# Patient Record
Sex: Male | Born: 1970 | Race: Black or African American | Hispanic: No | Marital: Married | Smoking: Former smoker
Health system: Southern US, Community
[De-identification: ages and names within clinical notes are randomized; demographics above are authoritative.]

## PROBLEM LIST (undated history)

## (undated) DIAGNOSIS — E785 Hyperlipidemia, unspecified: Secondary | ICD-10-CM

## (undated) DIAGNOSIS — E119 Type 2 diabetes mellitus without complications: Secondary | ICD-10-CM

## (undated) DIAGNOSIS — I10 Essential (primary) hypertension: Secondary | ICD-10-CM

## (undated) DIAGNOSIS — K859 Acute pancreatitis without necrosis or infection, unspecified: Secondary | ICD-10-CM

## (undated) DIAGNOSIS — M1990 Unspecified osteoarthritis, unspecified site: Secondary | ICD-10-CM

## (undated) HISTORY — PX: OTHER SURGICAL HISTORY: SHX169

## (undated) HISTORY — PX: BACK SURGERY: SHX140

## (undated) HISTORY — DX: Hyperlipidemia, unspecified: E78.5

## (undated) HISTORY — DX: Unspecified osteoarthritis, unspecified site: M19.90

## (undated) HISTORY — DX: Acute pancreatitis without necrosis or infection, unspecified: K85.90

---

## 2004-02-04 DIAGNOSIS — K859 Acute pancreatitis without necrosis or infection, unspecified: Secondary | ICD-10-CM

## 2004-02-04 HISTORY — DX: Acute pancreatitis without necrosis or infection, unspecified: K85.90

## 2004-09-07 ENCOUNTER — Emergency Department: Payer: Self-pay | Admitting: General Practice

## 2004-09-11 ENCOUNTER — Ambulatory Visit: Payer: Self-pay | Admitting: Specialist

## 2004-10-24 ENCOUNTER — Encounter: Admission: RE | Admit: 2004-10-24 | Discharge: 2004-10-24 | Payer: Self-pay | Admitting: Neurosurgery

## 2004-11-15 ENCOUNTER — Ambulatory Visit (HOSPITAL_COMMUNITY): Admission: RE | Admit: 2004-11-15 | Discharge: 2004-11-16 | Payer: Self-pay | Admitting: Neurosurgery

## 2005-09-02 ENCOUNTER — Ambulatory Visit: Payer: Self-pay | Admitting: Internal Medicine

## 2005-11-21 ENCOUNTER — Ambulatory Visit (HOSPITAL_COMMUNITY): Admission: RE | Admit: 2005-11-21 | Discharge: 2005-11-22 | Payer: Self-pay | Admitting: Neurosurgery

## 2006-05-17 ENCOUNTER — Emergency Department: Payer: Self-pay | Admitting: Emergency Medicine

## 2006-05-22 ENCOUNTER — Ambulatory Visit: Payer: Self-pay | Admitting: Neurosurgery

## 2006-05-26 ENCOUNTER — Encounter: Admission: RE | Admit: 2006-05-26 | Discharge: 2006-05-26 | Payer: Self-pay | Admitting: Neurosurgery

## 2006-08-03 ENCOUNTER — Inpatient Hospital Stay (HOSPITAL_COMMUNITY): Admission: RE | Admit: 2006-08-03 | Discharge: 2006-08-05 | Payer: Self-pay | Admitting: Neurosurgery

## 2006-09-14 ENCOUNTER — Emergency Department (HOSPITAL_COMMUNITY): Admission: EM | Admit: 2006-09-14 | Discharge: 2006-09-14 | Payer: Self-pay | Admitting: Emergency Medicine

## 2006-09-15 ENCOUNTER — Encounter: Admission: RE | Admit: 2006-09-15 | Discharge: 2006-09-15 | Payer: Self-pay | Admitting: Neurosurgery

## 2006-09-17 ENCOUNTER — Encounter: Admission: RE | Admit: 2006-09-17 | Discharge: 2006-09-17 | Payer: Self-pay | Admitting: Neurosurgery

## 2006-10-13 ENCOUNTER — Inpatient Hospital Stay (HOSPITAL_COMMUNITY): Admission: RE | Admit: 2006-10-13 | Discharge: 2006-10-15 | Payer: Self-pay | Admitting: Neurosurgery

## 2006-11-19 ENCOUNTER — Ambulatory Visit: Payer: Self-pay | Admitting: Neurosurgery

## 2007-01-12 ENCOUNTER — Encounter: Admission: RE | Admit: 2007-01-12 | Discharge: 2007-01-12 | Payer: Self-pay | Admitting: Neurosurgery

## 2007-02-18 ENCOUNTER — Encounter: Payer: Self-pay | Admitting: Neurosurgery

## 2007-03-07 ENCOUNTER — Encounter: Payer: Self-pay | Admitting: Neurosurgery

## 2007-04-04 ENCOUNTER — Encounter: Payer: Self-pay | Admitting: Neurosurgery

## 2007-05-05 ENCOUNTER — Encounter: Payer: Self-pay | Admitting: Neurosurgery

## 2007-06-04 ENCOUNTER — Encounter: Payer: Self-pay | Admitting: Neurosurgery

## 2007-08-30 ENCOUNTER — Ambulatory Visit: Payer: Self-pay | Admitting: Neurosurgery

## 2008-09-10 ENCOUNTER — Emergency Department: Payer: Self-pay | Admitting: Emergency Medicine

## 2008-09-12 ENCOUNTER — Inpatient Hospital Stay: Payer: Self-pay | Admitting: Internal Medicine

## 2008-10-16 ENCOUNTER — Ambulatory Visit: Payer: Self-pay | Admitting: Gastroenterology

## 2009-03-30 ENCOUNTER — Ambulatory Visit: Payer: Self-pay | Admitting: Neurosurgery

## 2009-05-04 ENCOUNTER — Inpatient Hospital Stay (HOSPITAL_COMMUNITY): Admission: RE | Admit: 2009-05-04 | Discharge: 2009-05-07 | Payer: Self-pay | Admitting: Neurosurgery

## 2009-05-30 ENCOUNTER — Ambulatory Visit: Payer: Self-pay | Admitting: Neurosurgery

## 2009-08-08 ENCOUNTER — Encounter: Admission: RE | Admit: 2009-08-08 | Discharge: 2009-08-08 | Payer: Self-pay | Admitting: Neurosurgery

## 2010-04-24 LAB — GLUCOSE, CAPILLARY
Glucose-Capillary: 124 mg/dL — ABNORMAL HIGH (ref 70–99)
Glucose-Capillary: 145 mg/dL — ABNORMAL HIGH (ref 70–99)
Glucose-Capillary: 148 mg/dL — ABNORMAL HIGH (ref 70–99)
Glucose-Capillary: 148 mg/dL — ABNORMAL HIGH (ref 70–99)
Glucose-Capillary: 149 mg/dL — ABNORMAL HIGH (ref 70–99)
Glucose-Capillary: 163 mg/dL — ABNORMAL HIGH (ref 70–99)
Glucose-Capillary: 172 mg/dL — ABNORMAL HIGH (ref 70–99)
Glucose-Capillary: 179 mg/dL — ABNORMAL HIGH (ref 70–99)
Glucose-Capillary: 282 mg/dL — ABNORMAL HIGH (ref 70–99)

## 2010-04-24 LAB — TYPE AND SCREEN
ABO/RH(D): B POS
Antibody Screen: NEGATIVE

## 2010-04-28 LAB — BASIC METABOLIC PANEL WITH GFR
BUN: 5 mg/dL — ABNORMAL LOW (ref 6–23)
CO2: 26 meq/L (ref 19–32)
Calcium: 9.6 mg/dL (ref 8.4–10.5)
Chloride: 105 meq/L (ref 96–112)
Creatinine, Ser: 0.82 mg/dL (ref 0.4–1.5)
GFR calc non Af Amer: >60 mL/min
Glucose, Bld: 163 mg/dL — ABNORMAL HIGH (ref 70–99)
Potassium: 3.9 meq/L (ref 3.5–5.1)
Sodium: 139 meq/L (ref 135–145)

## 2010-04-28 LAB — DIFFERENTIAL
Basophils Relative: 1 % (ref 0–1)
Eosinophils Absolute: 0.1 10*3/uL (ref 0.0–0.7)
Eosinophils Relative: 3 % (ref 0–5)
Lymphs Abs: 1.8 10*3/uL (ref 0.7–4.0)
Monocytes Relative: 9 % (ref 3–12)
Neutrophils Relative %: 45 % (ref 43–77)

## 2010-04-28 LAB — CBC
HCT: 38.3 % — ABNORMAL LOW (ref 39.0–52.0)
Hemoglobin: 13.2 g/dL (ref 13.0–17.0)
MCHC: 34.5 g/dL (ref 30.0–36.0)
MCV: 86 fL (ref 78.0–100.0)
Platelets: 204 10*3/uL (ref 150–400)
RBC: 4.46 MIL/uL (ref 4.22–5.81)
RDW: 13 % (ref 11.5–15.5)
WBC: 4.3 10*3/uL (ref 4.0–10.5)

## 2010-06-18 NOTE — Op Note (Signed)
Jon Hall, Jon Hall               ACCOUNT NO.:  [id number]   MEDICAL RECORD NO.:  [id number]          PATIENT TYPE:  INP   LOCATION:  3019                         FACILITY:  [facility]   PHYSICIAN:  Kathaleen Maser. Pool, M.D.    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   POSTOPERATIVE DIAGNOSIS:  Bilateral L5-S1 herniated nucleus pulposus  with radiculopathy status post L4-L5 decompression and fusion with  instrumentation.   PROCEDURE NOTE:  Bilateral L5-S1 laminectomy and discectomy, L5-S1  posterior lumbar interbody fusion rising Tangent interbody allograft  wedge, Telamon interbody PEEK cage and local autografting, re-  exploration of L4-L5 fusion with removal of hardware, L4, L5, and S1  posterolateral arthrodesis utilizing segmental pedicle screw fixation  and local autografting.   SURGEON:  Kathaleen Maser. Pool, M.D.   ASSISTANT:  Reinaldo Meeker, M.D.   ANESTHESIA:  General   INDICATIONS FOR PROCEDURE:  Jon Hall is a 40 year old male status  post previous L4-L5 decompression and fusion.  The patient had been  doing extremely well when he began to experience a left sided S1  radiculopathy.  The patient has a known small right sided L5-S1 disc  herniation but previously no disc herniation on the left side.  Workup  demonstrated evidence of a large acute disc herniation at L5-S1 on the  left side with a free fragment causing marked compression of the left  sided S1 nerve root.  The patient was counseled as to his options.  He  decided to proceed with laminectomy, discectomy, and fusion in hopes of  improving his symptoms.   OPERATIVE NOTE:  The patient was taken to the operating room and placed  on the operating table in a supine position.  After  an adequate level of  anesthesia was achieved, the patient was positioned prone onto the  Wilson frame and appropriately padded.  The patient's lumbar region was  prepped and draped sterilely.  A 10 blade was used to make a linear  incision overlying the L4, L5, and S1 levels.  This was carried down  sharply and subperiosteal performed exposing the lamina of L3, the  laminectomy defect at L4-L5, the lamina of L5, and the sacrum.  The  previously placed pedicle screw fixation at L4-L5 was dissected free.  Deep self-retaining retractor was placed.  Intraoperative fluoroscopy  views of the levels were confirmed.  Pedicle screw fixation was  disassembled.  The fusion was inspected and found to be quite solid.   Attention was then placed to the L5-S1 interspace.  A complete  laminectomy at L5 was then performed using Leksell rongeurs, Kerrison  rongeurs, and a high speed drill.  Inferior facetectomies were performed  bilaterally at L5, superior facetectomies were performed bilaterally at  S1, and the superior laminectomy at S1 was also performed.  Ligamentum  flavum and epidural scar was elevated and resected in a piecemeal  fashion.  Wide decompressive foraminotomies were then performed along  the course of the exiting L5 and S1 nerve roots bilaterally.  Epidural  venous plexus was coagulated and cut.   Attention was turned first to the patient's left side.  The thecal sac  and S1 nerve root were gently mobilized and retracted towards the  midline.  A large free disc herniation was encountered and completely  resected.  The disc space was incised with a 15 blade in a rectangular  fracture and wide disc space clean out was achieved using pituitary  rongeurs, upward and angled pituitary rongeurs and Epstein curets.  The  procedure was then repeated on the contralateral side.  The disc space  was then distracted up to 10 mm with a 10 mm distractor left in the  patient's left side.  Thecal sac  and nerve roots were protected on the  patient's right side.  The disc space then reamed and cut with 10 mm  Tangent instruments.  A 10 x 26 mm Tangent wedge was then impacted into  place and recessed approximately 1 mm from the posterior cortical  margin.   The distraction pins were placed on the patient's left side.  The thecal  sac and nerve roots were protected on the left side.  The disc space was  once again reamed and then cut with 10 mm Tangent instruments.  Soft  tissues were removed from the interspace.  The disc space was further  curettaged.  Morselized autograft mixed with putty was then packed in  the interspace.  A 10 x 26 mm Telamon cage was then impacted into place  and recessed approximately 2 mm to the posterior margin.  The pedicles  of S1 were then identified by surface landmarks and intraoperative  fluoroscopy.  Superficial bone around the pedicle was then removed using  a high speed drill.  Each pedicle was then probed using a pedicle awl.  An awl track was then tapped with a 5.25 mm screw tapper.  Each screw  tap hole was probed and found to be solid in bone.  6.75 x 40 mm radius  screws were placed bilaterally at S1.   The sacral ala and the L5 transverse processes were decorticated using a  high speed drill.  Morselized autograft was packed posterolaterally.  A  short segment titanium rod was then contoured and placed through the  screw heads at L4, L5, and S1.  The locking caps were then placed over  the screw heads.  The locking caps were then engaged with the construct  under compression.  Final images revealed good position of the bone  grafts, hardware, and normal spine.  A transverse connector was placed.  A medium Hemovac drain was left in the epidural space.  Gelfoam was  placed topically for hemostasis which found to be good.  The wound was  then closed in layers with Vicryl suture.  Steri-Strips and sterile  dressing were applied.  There were no  intraoperative complications.  The  patient tolerated the procedure well and he returns to the recovery room  for postoperative care.           ______________________________  Kathaleen Maser. Pool, M.D.     HAP/MEDQ  D:  10/13/2006  T:  10/13/2006  Job:  [id number]

## 2010-06-18 NOTE — Op Note (Signed)
NAMEJAQUEL, GLASSBURN               ACCOUNT NO.:  [id number]   MEDICAL RECORD NO.:  [id number]          PATIENT TYPE:  INP   LOCATION:  3005                         FACILITY:  [facility]   PHYSICIAN:  Kathaleen Maser. Pool, M.D.    DATE OF BIRTH:  Nov 15, 1970   DATE OF PROCEDURE:  08/03/2006  DATE OF DISCHARGE:                               OPERATIVE REPORT   SERVICE:  Neurosurgery.   PREOPERATIVE DIAGNOSIS:  L4-5 recurrent herniated nucleus pulposus with  radiculopathy.   POSTOPERATIVE DIAGNOSIS:  L4-5 recurrent herniated nucleus pulposus with  radiculopathy.   PROCEDURE:  Re-exploration at L4-5 laminotomy with bilateral L4-5 redo  microdiskectomies.  L4-5 posterior lumbar interbody fusion utilizing  Tangent interbody allograft wedge, Telamon interbody PEEK cage and local  autografting.  L4-5 posterolateral arthrodesis utilizing nonsegmental  pedicle screw instrumentation and local autograft.   SURGEON:  Kathaleen Maser. Pool, M.D.   ASSISTANT:  Reinaldo Meeker, M.D.   ANESTHESIA:  General.   INDICATION:  Mr. Wieck is a 40 year old male who is status post 2  previous L4-5 left-sided laminotomies and microdiskectomy.  The patient  presents now with severe recurrent left lower extremity pain consistent  with a left-sided L4 and L5 radicular pattern.  Workup has demonstrated  evidence of progressive collapse of the L4-5 interspace with foraminal  stenosis and some degree of foraminal and extraforaminal disk herniation  causing compression at both the L5 and L4 nerve roots.  The patient has  failed conservative management.  He presents now for decompression and  fusion in hopes of improving his symptoms.   OPERATIVE NOTE:  The patient was brought to the operating room and  placed on the operative table in supine position.  After an adequate  level of anesthesia was achieved, the patient was positioned prone on  the Wilson frame and appropriately padded.  The patient's lumbar region  was prepped  and draped sterilely.  A 10 blade was used to make a linear  skin incision overlying the L3, 4 and 5 levels.  This was carried down  sharply in the midline.  Subperiosteal dissection was performed exposing  the lamina and facet joints at L3, L4 and L5 as well as the transverse  processes of L4 and L5.  A deep self-retaining retractor was placed.  Intraoperative fluoroscopy was used and levels were confirmed.  Previous  laminotomy on the left side at L4-5 was dissected free.  A complete  laminectomy was then performed using Leksell rongeurs, Kerrison rongeurs  and a high-speed drill to remove the entire lamina of L4 and the  superior aspect of the lamina of L5 as well as the inferior facet of L4  and the superior facet of L5 bilaterally.  All bone was cleaned and used  in later autografting.  The ligament flavum and epidural scar was then  elevated and resected in piecemeal fashion.  The underlying thecal sac  and exiting L4 and L5 nerve roots were identified.  Wide decompressive  foraminotomies were performed along the course of the exiting L4 and L5  nerve roots bilaterally.  Epidural venous plexus was  coagulated and cut.  Starting first on the patient's left side, thecal sac and nerve roots  were gradually mobilized.  Epidural scar was dissected free.  Disk  herniation was readily observed.  This was then incised with a 15 blade  in a rectangular fashion.  A wide disk space clean-out was then achieved  using pituitary rongeurs, upward-angled and pituitary rongeurs and  Epstein curettes.  After a very thorough diskectomy was performed on the  patient's left side, the procedure was then repeated on the  contralateral side again without difficulty.  A 10-mL distractor was  placed on the patient's right side.  Thecal sac and nerve roots were  protected on the left side.  Disk space was then reamed and then cut  with 10-mm TANGENT instrument.  The soft tissues were removed from the   interspace.  A 10 x 26-mm, Telamon cage was then impacted into place.  The cage was packed with morselized autograft mixed with [employer] bone  putty.  Distractors were removed from the patient's right side.  Thecal  sac and nerve roots were protected on the right side.  Disk space was  then reamed and then cut with 10-mm instruments.  Soft tissues were  removed from the interspace.  Disk space had further curettage.  Morselized autograft was then packed into the interspace.  A 10 x 26-mm  TANGENT wedge was then impacted into place and recessed approximately 2  mm from the posterior cortical margin.  Pedicles at L4 and L5 were then  identified using surface landmarks and intraoperative fluoroscopy.  Superficial bone from the underlying pedicles was then removed using a  high-speed drill.  Each pedicle hole was then probed using a pedicle  awl.  Each pedicle awl tract was then tapped with a 5.25-mm screw  tapper.  Each screw tap hole was probed and found be solid within  apparent bone.  The 6.75 x 45-mm radius screws were placed bilaterally  at L4, the 6.75 x 40-mm screws were placed on the left side at L5 and  6.75 x 35-mm screws placed in the right side at L5.  All 4 screw were  found to be solid in bone.  Transverse processes of L4 and L5 were then  decorticated using a high-speed drill.  Morselized autograft was packed  posterolaterally for later fusion.  A short-segment titanium rod was  then placed over the screw heads.  The locking caps were then placed  over the screw heads.  Locking caps were then engaged in the construct  under compression.  Final images revealed good position of the  previously placed hardware at the proper operative level in the lumbar  spine.  Wound was then irrigated one final time.  A transverse connector  was also placed.  A medium Hemovac drain was left in the epidural space.  Wound was then closed in layers with Vicryl sutures.  Steri-Strips and sterile  dressings were applied.  There were no apparent operative  complications.  The patient tolerated the procedure well and he returns  to the recovery room postoperatively.           ______________________________  Kathaleen Maser Pool, M.D.     HAP/MEDQ  D:  08/03/2006  T:  08/04/2006  Job:  [id number]

## 2010-06-21 NOTE — Op Note (Signed)
Jon Hall, Jon Hall               ACCOUNT NO.:  [id number]   MEDICAL RECORD NO.:  [id number]          PATIENT TYPE:  AMB   LOCATION:  SDS                          FACILITY:  [facility]   PHYSICIAN:  Henry A. Pool, M.D.    DATE OF BIRTH:  11/22/1970   DATE OF PROCEDURE:  11/21/2005  DATE OF DISCHARGE:                                 OPERATIVE REPORT   PREOPERATIVE DIAGNOSIS:  Left L4-5 recurrent herniated nucleus pulposus with  radiculopathy.   POSTOPERATIVE DIAGNOSIS:  Left L4-5 recurrent herniated nucleus pulposus  with radiculopathy.   PROCEDURE NOTE:  Left L4-5 re-exploration of laminotomy with redo  microdiskectomy.   SURGEON:  Kathaleen Maser. Pool, M.D.   ASSISTANT:  Tia Alert, MD.   ANESTHESIA:  General endotracheal anesthesia.   INDICATIONS FOR PROCEDURE:  [name] is a 40 year old male with history  of previous left-sided L4-5 laminotomy and microdiskectomy with good  results.  Presents now with recurrent left lower extremity radicular pain  consistent left-sided L5 radiculopathy.  Workup demonstrates evidence of  significant disk reherniation.  The patient has failed conservative  management.  He presents now for redo microdiskectomy in hopes of improving  his symptoms.   DESCRIPTION OF PROCEDURE:  Patient taken to the operating room and placed on  the operating table in supine position.  After adequate anesthesia was  achieved, the patient positioned prone onto Wilson frame, appropriately  padded the patient's lumbar regions, prepped and draped sterilely.  A 10-  blade was used to make a linear skin incision overlying L4-5 interspace.  Subperiosteal dissection  performed on the left side, exposing the lamina of  facet joints of L4-L5.  Deep self-retaining retractor was placed.  X-rays  taken and levels confirmed.  Laminotomy was dissected free.  Laminotomy was  then widened slightly.  Microscope brought into the field and used for  microdissection.  The left sided L4  nerve root was identified just medial to  the L4 pedicle.  The L4 nerve root and thecal sac were then gradually  mobilized and tracked towards the midline.  Epidural venous plexus  coagulated and cut.  The recurrent disk herniation was readily apparent.  This was then incised with 15 blade in rectangular fashion.  A wide disc  space was then achieved using pituitary rongeurs, up-biting pituitary  rongeurs and Epstein curettes.  Elements of the recurrent disk herniation  were completely resected.  All loose fragments of degenerative disc material  was removed from the interspace.  At this point a very thorough  decompression had been achieved.  There was no injury to the thecal sac and  nerve roots.  Wound was then irrigated with antibiotic solution.  Gelfoam  was placed topically for hemostasis and found to be good.  Microscope and  retractor system were removed.  Hemostasis  achieved with electrocautery.  Wound was then closed in layers with Vicryl  sutures.  Steri-Strips and sterile dressing were applied.  There were no  apparent complications.  The patient tolerated the procedure well and he  returns to the recovery room postoperatively.  ______________________________  Kathaleen Maser Pool, M.D.     HAP/MEDQ  D:  11/21/2005  T:  11/23/2005  Job:  [id number]

## 2010-06-21 NOTE — Op Note (Signed)
Jon Hall, Jon Hall               ACCOUNT NO.:  [id number]   MEDICAL RECORD NO.:  [id number]          PATIENT TYPE:  AMB   LOCATION:  SDS                          FACILITY:  [facility]   PHYSICIAN:  Henry A. Pool, M.D.    DATE OF BIRTH:  1970-11-16   DATE OF PROCEDURE:  11/15/2004  DATE OF DISCHARGE:                                 OPERATIVE REPORT   PREOPERATIVE DIAGNOSIS:  Left L4-5 herniated nucleus pulposus with  radiculopathy.   POSTOPERATIVE DIAGNOSIS:  Left L4-5 herniated nucleus pulposus with  radiculopathy.   PROCEDURE:  Left L4-5 laminotomy and microdiskectomy.   SURGEON:  Kathaleen Maser. Pool, M.D.   ASSISTANT:  Tia Alert, M.D.   ANESTHESIA:  General orotracheal.   INDICATIONS FOR PROCEDURE:  [name] is a 40 year old male with a history  of back and left lower extremity pain consistent with a left-sided L5  radiculopathy.  Workup demonstrates evidence of a leftward L4-5 disk  herniation with compression of thecal sac and left L5 nerve root.  The  patient presents now for laminotomy and microdiskectomy in hopes of  improving his symptoms.   DESCRIPTION OF PROCEDURE:  The patient was taken to the operating room and  placed on the table in the supine position.  After adequate level of  anesthesia achieved, the patient positioned prone onto Wilson frame.  Appropriately padded __________ lumbar regions.  Prepped and draped  sterilely.  10 blade was used to make a linear skin incision overlying the  L4-5 interspace.  This was carried down sharply in the midline.  Subperiosteal dissection was then performed through the lamina of facet  joints L4-5 on the left side.  Deep self-retaining retractor was placed.  X-  ray was taken and level was confirmed.  Laminotomy was then performed using  high speed drill and Kerrison rongeurs to remove the inferior aspect of the  lamina of L4, medial aspect of the L4-5 facet joint and superior rim of the  L5 lamina.  Ligamentum flavum was  then elevated and resected in a piecemeal  fashion using Kerrison rongeurs.  Underlying thecal sac and exiting L5 nerve  root were identified.  Microscope was brought into the field and used  throughout the remainder of the diskectomy.  Remaining aspects of the  epidural plexus were coagulated and cut.  Thecal sac and L5 nerve root were  mobilized and retracted toward the midline.  Disk herniation was readily  apparent.  This was then incised with a 15 blade in a rectangular fashion.  A wide disk space clean out was then achieved using pituitary rongeurs, and  Epstein curets.  All disk material was removed from the interspace.  All  elements of disk herniation were completely resected.  The wound was  irrigated with antibiotic solution.  Gelfoam was placed topically for  hemostasis and found to be good.  Microscope and retraction system were  removed.  Hemostasis was achieved with electrocautery.  Wound was then  closed in typical fashion.  Steri-Strips and sterile dressing were applied.  There was complications.  The patient tolerated the procedure  well and he  returned to the recovery room postoperatively.           ______________________________  Kathaleen Maser Pool, M.D.     HAP/MEDQ  D:  11/15/2004  T:  11/15/2004  Job:  [id number]

## 2010-06-21 NOTE — Consult Note (Signed)
NAMESOFIA, JAQUITH               ACCOUNT NO.:  [id number]   MEDICAL RECORD NO.:  [id number]          PATIENT TYPE:  OIB   LOCATION:  3009                         FACILITY:  [facility]   PHYSICIAN:  Sherin Quarry, MD      DATE OF BIRTH:  1970-04-05   DATE OF CONSULTATION:  11/15/2004  DATE OF DISCHARGE:                                   CONSULTATION   HISTORY OF PRESENT ILLNESS:  Boniface Goffe. Kliebert is a 40 year old man who  reports that about two weeks prior to his hospitalization for back surgery  he noted the onset of excessive thirst, urinary frequency, weakness and  blurry vision.  He had nocturia several times each night and would have to  get up at night in order to drink water in between the times he would go to  the bathroom.  Because he was preoccupied with his back he did not really  take any action about these symptoms.  On November 08, 2004 the patient had  preoperative laboratory studies which showed a blood sugar of 249.  Today  Mr. Ojo underwent a L4-5 microdiskectomy by Dr. Dutch Quint.  His  postoperative course has been very uneventful.  He is doing extremely well.  His blood sugar at 11:40 was 217.  When it was repeated at 1500 it was 330.  At that time Dr. Wynetta Emery was consulted and he gave the patient 10 units of  regular insulin.  When Dr. Wynetta Emery was contacted again he instructed the nurse  to call Dr. Julio Sicks.  Dr. Julio Sicks ordered 10 units of IV insulin for  the patient.  His blood sugar was repeated around 7 p.m. and was 305.  The  patient's planned discharge was cancelled.  Mr. Wisenbaker indicates that he  has no previous history of diabetes or abnormal blood sugars.  He does  recall that he has several family members with a history of diabetes  including aunts, uncles and possibly cousins.  His mother, father, brothers  and sisters do not have diabetes as far as he knows.   Mr. Rappaport only other active medical problem is hypertension.  He has a  five year history of  hypertension which is generally regulated with Lotrel  10/12.5 one daily.  He had been placed on Norvasc before being switched to  Lotrel.  To the best of his knowledge he has no history of stroke, heart  disease or congestive heart failure.   ALLERGIES:  No known drug allergies.   CURRENT MEDICATIONS:  Lotrel 10/12.5 mg one daily.   PAST SURGICAL HISTORY:  He has had two previous knee surgeries as well as an  epidural steroid injection in his back.   PAST MEDICAL HISTORY:  Medical illnesses are essentially none except as  described above.   FAMILY HISTORY:  Negative except as described above with the exception that  his father died as a result of a ruptured intracranial aneurysm.   SOCIAL HISTORY:  He states that he does not abuse alcohol or drugs.  He  lives with his wife who is very concerned about his medical care.  REVIEW OF SYSTEMS:  Head:  He denies headache or dizziness.  Eyes:  He  denies visual blurring or diplopia.  Ears, nose and throat:  He denies ear  aches, sinus pain or sore throat.  Chest:  He denies cough, wheezing or  chest congestion.  Cardiovascular:  He denies orthopnea, PND or ankle edema.  GI:  He denies nausea, vomiting, abdominal pain, change in bowel habits,  melena or hematochezia.  GU:  See above.  There has been no dysuria or  hematuria.  Neurologic:  There is no history of seizure or stroke.  Endocrine:  See above.   PHYSICAL EXAMINATION:  GENERAL:  He is a very cooperative, pleasant young  man who is in no distress.  VITAL SIGNS:  Blood pressure 116/67, O2 saturation 95%, temperature 98.1.  HEENT:  Within normal limits.  CHEST:  Clear.  CARDIOVASCULAR:  Reveals normal S1 and S2 without rubs, murmurs or gallops.  ABDOMEN:  Benign.  BACK:  There is a clean, dry wound.  EXTREMITIES:  Neurologic testing examination of the extremities is normal.   IMPRESSION:  1.  New onset of diabetes, symptoms for two weeks.  2.  Hypertension.  3.  Status post  L4-5 HNP.  Status post microdiskectomy.  4.  History of arthroscopy x2.  5.  History of hyperlipidemia.   PLAN:  As the patient has received both subcutaneous intravenous insulin I  think that his discharge needs to be postponed at least until the morning  and we need to follow his blood sugar.  I would recommend that we place him  on a sliding scale insulin regimen for the time being.  In the morning I  would suggest that the patient be started on Glucophage 500 mg a.c.  breakfast and supper in addition to his blood pressure medication.  An A1C  hemoglobin  should be obtained.  He needs instruction in diabetic diet and  also in checking his blood sugars with the use of a Glucometer.  He also  needs to followup closely with his primary doctor Dr. Mila Merry in  [place].           ______________________________  Sherin Quarry, MD     SY/MEDQ  D:  11/15/2004  T:  11/16/2004  Job:  [id number]   cc:   Dr. Sherilyn Cooter A. Elmarie Mainland  Fax: [phone]

## 2010-07-02 ENCOUNTER — Ambulatory Visit: Payer: Self-pay | Admitting: Internal Medicine

## 2010-11-15 LAB — PROTIME-INR
INR: 1
Prothrombin Time: 12.9

## 2010-11-15 LAB — CBC
Hemoglobin: 13.1
MCHC: 33.9
Platelets: 270
RDW: 13.1

## 2010-11-15 LAB — APTT: aPTT: 29

## 2010-11-15 LAB — BASIC METABOLIC PANEL
CO2: 28
Calcium: 9.8
Creatinine, Ser: 0.79
GFR calc non Af Amer: >60
Glucose, Bld: 114 — ABNORMAL HIGH
Sodium: 139

## 2010-11-15 LAB — DIFFERENTIAL
Basophils Absolute: 0
Basophils Relative: 0
Lymphocytes Relative: 39
Neutro Abs: 2.6
Neutrophils Relative %: 51

## 2010-11-15 LAB — TYPE AND SCREEN: ABO/RH(D): B POS

## 2010-11-20 LAB — CBC
MCHC: 33.7
MCV: 86.2
RBC: 4.89
RDW: 13.4

## 2010-11-20 LAB — BASIC METABOLIC PANEL
CO2: 25
Calcium: 9.6
Chloride: 103
Creatinine, Ser: 0.79
GFR calc Af Amer: >60
Glucose, Bld: 158 — ABNORMAL HIGH

## 2010-11-20 LAB — TYPE AND SCREEN: Antibody Screen: NEGATIVE

## 2010-11-20 LAB — DIFFERENTIAL
Basophils Absolute: 0
Basophils Relative: 1
Eosinophils Absolute: 0.1
Monocytes Absolute: 0.4
Monocytes Relative: 7
Neutrophils Relative %: 51

## 2011-10-10 ENCOUNTER — Ambulatory Visit: Payer: Self-pay

## 2011-11-04 ENCOUNTER — Ambulatory Visit: Payer: Self-pay

## 2011-12-05 ENCOUNTER — Ambulatory Visit: Payer: Self-pay

## 2012-03-01 ENCOUNTER — Ambulatory Visit: Payer: Self-pay | Admitting: Neurosurgery

## 2012-04-01 ENCOUNTER — Other Ambulatory Visit: Payer: Self-pay | Admitting: Neurosurgery

## 2012-04-02 ENCOUNTER — Encounter (HOSPITAL_COMMUNITY): Payer: Self-pay | Admitting: Pharmacy Technician

## 2012-04-14 ENCOUNTER — Encounter (HOSPITAL_COMMUNITY): Payer: Self-pay

## 2012-04-14 ENCOUNTER — Encounter (HOSPITAL_COMMUNITY)
Admission: RE | Admit: 2012-04-14 | Discharge: 2012-04-14 | Disposition: A | Discharge status: Home / Self Care | Payer: 59 | Source: Ambulatory Visit | Attending: Neurosurgery | Admitting: Neurosurgery

## 2012-04-14 HISTORY — DX: Type 2 diabetes mellitus without complications: E11.9

## 2012-04-14 HISTORY — DX: Essential (primary) hypertension: I10

## 2012-04-14 LAB — BASIC METABOLIC PANEL
BUN: 8 mg/dL (ref 6–23)
CO2: 25 mEq/L (ref 19–32)
Chloride: 103 mEq/L (ref 96–112)
Creatinine, Ser: 0.76 mg/dL (ref 0.50–1.35)
Potassium: 4 mEq/L (ref 3.5–5.1)

## 2012-04-14 LAB — CBC WITH DIFFERENTIAL/PLATELET
Basophils Absolute: 0 10*3/uL (ref 0.0–0.1)
Eosinophils Relative: 3 % (ref 0–5)
HCT: 39 % (ref 39.0–52.0)
Hemoglobin: 13.2 g/dL (ref 13.0–17.0)
Lymphocytes Relative: 39 % (ref 12–46)
MCV: 82.1 fL (ref 78.0–100.0)
Monocytes Absolute: 0.4 10*3/uL (ref 0.1–1.0)
Monocytes Relative: 8 % (ref 3–12)
RDW: 12.6 % (ref 11.5–15.5)
WBC: 4.4 10*3/uL (ref 4.0–10.5)

## 2012-04-14 LAB — TYPE AND SCREEN

## 2012-04-14 LAB — SURGICAL PCR SCREEN: Staphylococcus aureus: NEGATIVE

## 2012-04-14 NOTE — Progress Notes (Signed)
04/14/12 1010  OBSTRUCTIVE SLEEP APNEA  Have you ever been diagnosed with sleep apnea through a sleep study? No  Do you snore loudly (loud enough to be heard through closed doors)?  1  Do you often feel tired, fatigued, or sleepy during the daytime? 0  Has anyone observed you stop breathing during your sleep? 0  Do you have, or are you being treated for high blood pressure? 1  BMI more than 35 kg/m2? 1  Age over 44 years old? 0  Neck circumference greater than 40 cm/18 inches? 0  Gender: 1  Obstructive Sleep Apnea Score 4

## 2012-04-14 NOTE — Progress Notes (Signed)
EKG and CXR requested from [place].

## 2012-04-14 NOTE — Pre-Procedure Instructions (Signed)
Jon Hall  04/14/2012   Your procedure is scheduled on:  04-16-2012  Report to [facility] at 5:30  AM.  Call this number if you have problems the morning of surgery: [phone]   Remember:   Do not eat food or drink liquids after midnight.   Take these medicines the morning of surgery with A SIP OF WATER: pain medication as needed   Do not wear jewelry,  Do not wear lotions, powders, or perfumes. You may wear deodorant.  Do not shave 48 hours prior to surgery. Men may shave face and neck.  Do not bring valuables to the hospital.  Contacts, dentures or bridgework may not be worn into surgery.  Leave suitcase in the car. After surgery it may be brought to your room.   For patients admitted to the hospital, checkout time is 11:00 AM the day of  discharge.   Patients discharged the day of surgery will not be allowed to drive home   Special Instructions: Shower using CHG 2 nights before surgery and the night before surgery.  If you shower the day of surgery use CHG.  Use special wash - you have one bottle of CHG for all showers.  You should use approximately 1/3 of the bottle for each shower.   Please read over the following fact sheets that you were given: Pain Booklet, Coughing and Deep Breathing, Blood Transfusion Information and Surgical Site Infection Prevention

## 2012-04-14 NOTE — Progress Notes (Signed)
This pt. Has screened at an elevated risk for obstructive sleep apnea using the STOP Bang tool during a pre-surgical visit.

## 2012-04-15 MED ORDER — DEXTROSE 5 % IV SOLN
3.0000 g | INTRAVENOUS | Status: AC
  Administered 2012-04-16: 3 g via INTRAVENOUS
  Filled 2012-04-15 (×2): qty 3000

## 2012-04-15 MED ORDER — DEXAMETHASONE SODIUM PHOSPHATE 10 MG/ML IJ SOLN: 10.0000 mg | INTRAMUSCULAR | Status: DC

## 2012-04-16 ENCOUNTER — Inpatient Hospital Stay (HOSPITAL_COMMUNITY)
Admission: RE | Admit: 2012-04-16 | Discharge: 2012-04-19 | DRG: 460 | Disposition: A | Discharge status: Home / Self Care | Payer: 59 | Source: Ambulatory Visit | Attending: Neurosurgery | Admitting: Neurosurgery

## 2012-04-16 ENCOUNTER — Encounter (HOSPITAL_COMMUNITY): Payer: Self-pay | Admitting: Anesthesiology

## 2012-04-16 ENCOUNTER — Ambulatory Visit (HOSPITAL_COMMUNITY): Payer: 59 | Admitting: Anesthesiology

## 2012-04-16 ENCOUNTER — Encounter (HOSPITAL_COMMUNITY): Payer: Self-pay

## 2012-04-16 ENCOUNTER — Ambulatory Visit (HOSPITAL_COMMUNITY): Payer: 59

## 2012-04-16 ENCOUNTER — Encounter (HOSPITAL_COMMUNITY)
Admission: RE | Disposition: A | Discharge status: Home / Self Care | Payer: Self-pay | Source: Ambulatory Visit | Attending: Neurosurgery

## 2012-04-16 DIAGNOSIS — Z794 Long term (current) use of insulin: Secondary | ICD-10-CM

## 2012-04-16 DIAGNOSIS — Z01812 Encounter for preprocedural laboratory examination: Secondary | ICD-10-CM

## 2012-04-16 DIAGNOSIS — E119 Type 2 diabetes mellitus without complications: Secondary | ICD-10-CM | POA: Diagnosis present

## 2012-04-16 DIAGNOSIS — I10 Essential (primary) hypertension: Secondary | ICD-10-CM | POA: Diagnosis present

## 2012-04-16 DIAGNOSIS — M5126 Other intervertebral disc displacement, lumbar region: Principal | ICD-10-CM | POA: Diagnosis present

## 2012-04-16 DIAGNOSIS — Z79899 Other long term (current) drug therapy: Secondary | ICD-10-CM

## 2012-04-16 DIAGNOSIS — M48062 Spinal stenosis, lumbar region with neurogenic claudication: Secondary | ICD-10-CM | POA: Diagnosis present

## 2012-04-16 LAB — GLUCOSE, CAPILLARY
Glucose-Capillary: 152 mg/dL — ABNORMAL HIGH (ref 70–99)
Glucose-Capillary: 155 mg/dL — ABNORMAL HIGH (ref 70–99)

## 2012-04-16 SURGERY — POSTERIOR LUMBAR FUSION 1 LEVEL
Anesthesia: General | Site: Back | Wound class: Clean

## 2012-04-16 MED ORDER — GLYCOPYRROLATE 0.2 MG/ML IJ SOLN
INTRAMUSCULAR | Status: DC | PRN
  Administered 2012-04-16: .6 mg via INTRAVENOUS

## 2012-04-16 MED ORDER — FENOFIBRATE 160 MG PO TABS
160.0000 mg | ORAL_TABLET | Freq: Every day | ORAL | Status: DC
  Administered 2012-04-16 – 2012-04-19 (×4): 160 mg via ORAL
  Filled 2012-04-16 (×4): qty 1

## 2012-04-16 MED ORDER — ALBUMIN HUMAN 5 % IV SOLN
INTRAVENOUS | Status: DC | PRN
  Administered 2012-04-16 (×2): via INTRAVENOUS

## 2012-04-16 MED ORDER — ZOLPIDEM TARTRATE 5 MG PO TABS: 5.0000 mg | ORAL_TABLET | Freq: Every evening | ORAL | Status: DC | PRN

## 2012-04-16 MED ORDER — ACETAMINOPHEN 650 MG RE SUPP: 650.0000 mg | RECTAL | Status: DC | PRN

## 2012-04-16 MED ORDER — PHENYLEPHRINE HCL 10 MG/ML IJ SOLN
10.0000 mg | INTRAVENOUS | Status: DC | PRN
  Administered 2012-04-16: 60 ug/min via INTRAVENOUS

## 2012-04-16 MED ORDER — LISINOPRIL 40 MG PO TABS
40.0000 mg | ORAL_TABLET | Freq: Every day | ORAL | Status: DC
  Administered 2012-04-17 – 2012-04-19 (×3): 40 mg via ORAL
  Filled 2012-04-16 (×4): qty 1

## 2012-04-16 MED ORDER — ONDANSETRON HCL 4 MG/2ML IJ SOLN: 4.0000 mg | Freq: Once | INTRAMUSCULAR | Status: DC | PRN

## 2012-04-16 MED ORDER — DIAZEPAM 5 MG PO TABS
ORAL_TABLET | ORAL | Status: AC
  Administered 2012-04-16: 5 mg via ORAL
  Filled 2012-04-16: qty 1

## 2012-04-16 MED ORDER — ACETAMINOPHEN 10 MG/ML IV SOLN
INTRAVENOUS | Status: AC
  Filled 2012-04-16: qty 100

## 2012-04-16 MED ORDER — INSULIN ASPART PROT & ASPART (70-30 MIX) 100 UNIT/ML ~~LOC~~ SUSP
42.0000 [IU] | Freq: Every day | SUBCUTANEOUS | Status: DC
  Administered 2012-04-16 – 2012-04-17 (×2): 42 [IU] via SUBCUTANEOUS
  Filled 2012-04-16: qty 10

## 2012-04-16 MED ORDER — POLYETHYLENE GLYCOL 3350 17 G PO PACK
17.0000 g | PACK | Freq: Every day | ORAL | Status: DC | PRN
  Administered 2012-04-18: 17 g via ORAL
  Filled 2012-04-16: qty 1

## 2012-04-16 MED ORDER — LIDOCAINE HCL (CARDIAC) 20 MG/ML IV SOLN
INTRAVENOUS | Status: DC | PRN
  Administered 2012-04-16: 70 mg via INTRAVENOUS

## 2012-04-16 MED ORDER — LACTATED RINGERS IV SOLN
INTRAVENOUS | Status: DC | PRN
  Administered 2012-04-16 (×2): via INTRAVENOUS

## 2012-04-16 MED ORDER — ACETAMINOPHEN 325 MG PO TABS: 650.0000 mg | ORAL_TABLET | ORAL | Status: DC | PRN

## 2012-04-16 MED ORDER — CEFAZOLIN SODIUM 1-5 GM-% IV SOLN
1.0000 g | Freq: Three times a day (TID) | INTRAVENOUS | Status: AC
  Administered 2012-04-16 (×2): 1 g via INTRAVENOUS
  Filled 2012-04-16 (×3): qty 50

## 2012-04-16 MED ORDER — INSULIN ASPART PROT & ASPART (70-30 MIX) 100 UNIT/ML ~~LOC~~ SUSP
35.0000 [IU] | Freq: Every day | SUBCUTANEOUS | Status: DC
  Administered 2012-04-19: 35 [IU] via SUBCUTANEOUS
  Filled 2012-04-16: qty 10

## 2012-04-16 MED ORDER — HYDROCODONE-ACETAMINOPHEN 5-325 MG PO TABS: 1.0000 | ORAL_TABLET | ORAL | Status: DC | PRN

## 2012-04-16 MED ORDER — ALUM & MAG HYDROXIDE-SIMETH 200-200-20 MG/5ML PO SUSP
30.0000 mL | Freq: Four times a day (QID) | ORAL | Status: DC | PRN
  Administered 2012-04-17 – 2012-04-18 (×2): 30 mL via ORAL
  Filled 2012-04-16 (×2): qty 30

## 2012-04-16 MED ORDER — PHENYLEPHRINE HCL 10 MG/ML IJ SOLN
INTRAMUSCULAR | Status: DC | PRN
  Administered 2012-04-16 (×2): 120 ug via INTRAVENOUS

## 2012-04-16 MED ORDER — GLIMEPIRIDE 4 MG PO TABS
4.0000 mg | ORAL_TABLET | Freq: Every day | ORAL | Status: DC
Start: 2012-04-17 — End: 2012-04-19
  Administered 2012-04-17 – 2012-04-19 (×3): 4 mg via ORAL
  Filled 2012-04-16 (×4): qty 1

## 2012-04-16 MED ORDER — HYDROMORPHONE HCL PF 1 MG/ML IJ SOLN
0.2500 mg | INTRAMUSCULAR | Status: DC | PRN
  Administered 2012-04-16: 0.5 mg via INTRAVENOUS

## 2012-04-16 MED ORDER — 0.9 % SODIUM CHLORIDE (POUR BTL) OPTIME
TOPICAL | Status: DC | PRN
  Administered 2012-04-16: 1000 mL

## 2012-04-16 MED ORDER — SODIUM CHLORIDE 0.9 % IV SOLN
INTRAVENOUS | Status: AC
  Filled 2012-04-16: qty 500

## 2012-04-16 MED ORDER — SUFENTANIL CITRATE 50 MCG/ML IV SOLN
INTRAVENOUS | Status: DC | PRN
  Administered 2012-04-16 (×2): 10 ug via INTRAVENOUS
  Administered 2012-04-16: 5 ug via INTRAVENOUS
  Administered 2012-04-16: 25 ug via INTRAVENOUS

## 2012-04-16 MED ORDER — PHENOL 1.4 % MT LIQD
1.0000 | OROMUCOSAL | Status: DC | PRN
Start: 2012-04-16 — End: 2012-04-19

## 2012-04-16 MED ORDER — DIAZEPAM 5 MG PO TABS
5.0000 mg | ORAL_TABLET | Freq: Four times a day (QID) | ORAL | Status: DC | PRN
  Administered 2012-04-17 – 2012-04-19 (×6): 5 mg via ORAL
  Filled 2012-04-16 (×6): qty 1

## 2012-04-16 MED ORDER — HYDROMORPHONE HCL PF 1 MG/ML IJ SOLN
0.5000 mg | INTRAMUSCULAR | Status: DC | PRN
  Administered 2012-04-16 – 2012-04-18 (×7): 1 mg via INTRAVENOUS
  Filled 2012-04-16 (×7): qty 1

## 2012-04-16 MED ORDER — METFORMIN HCL ER 500 MG PO TB24
1000.0000 mg | ORAL_TABLET | Freq: Two times a day (BID) | ORAL | Status: DC
  Administered 2012-04-16 – 2012-04-19 (×6): 1000 mg via ORAL
  Filled 2012-04-16 (×9): qty 2

## 2012-04-16 MED ORDER — SODIUM CHLORIDE 0.9 % IJ SOLN: 3.0000 mL | INTRAMUSCULAR | Status: DC | PRN

## 2012-04-16 MED ORDER — HYDROMORPHONE HCL PF 1 MG/ML IJ SOLN
INTRAMUSCULAR | Status: AC
  Administered 2012-04-16: 0.5 mg via INTRAVENOUS
  Filled 2012-04-16: qty 1

## 2012-04-16 MED ORDER — ONDANSETRON HCL 4 MG/2ML IJ SOLN
4.0000 mg | INTRAMUSCULAR | Status: DC | PRN
  Administered 2012-04-17: 4 mg via INTRAVENOUS
  Filled 2012-04-16: qty 2

## 2012-04-16 MED ORDER — ACETAMINOPHEN 10 MG/ML IV SOLN
1000.0000 mg | Freq: Once | INTRAVENOUS | Status: AC | PRN
  Administered 2012-04-16: 1000 mg via INTRAVENOUS

## 2012-04-16 MED ORDER — ONDANSETRON HCL 4 MG/2ML IJ SOLN
INTRAMUSCULAR | Status: DC | PRN
  Administered 2012-04-16: 4 mg via INTRAVENOUS

## 2012-04-16 MED ORDER — NEOSTIGMINE METHYLSULFATE 1 MG/ML IJ SOLN
INTRAMUSCULAR | Status: DC | PRN
  Administered 2012-04-16: 5 mg via INTRAVENOUS

## 2012-04-16 MED ORDER — DEXAMETHASONE SODIUM PHOSPHATE 10 MG/ML IJ SOLN
INTRAMUSCULAR | Status: AC
  Administered 2012-04-16: 10 mg via INTRAVENOUS
  Filled 2012-04-16: qty 1

## 2012-04-16 MED ORDER — INSULIN ASPART PROT & ASPART (70-30 MIX) 100 UNIT/ML ~~LOC~~ SUSP
35.0000 [IU] | Freq: Two times a day (BID) | SUBCUTANEOUS | Status: DC
  Filled 2012-04-16: qty 10

## 2012-04-16 MED ORDER — OXYCODONE-ACETAMINOPHEN 5-325 MG PO TABS
ORAL_TABLET | ORAL | Status: AC
  Administered 2012-04-16: 2 via ORAL
  Filled 2012-04-16: qty 2

## 2012-04-16 MED ORDER — BACITRACIN 50000 UNITS IM SOLR
INTRAMUSCULAR | Status: AC
  Filled 2012-04-16: qty 1

## 2012-04-16 MED ORDER — BUPIVACAINE HCL (PF) 0.25 % IJ SOLN
INTRAMUSCULAR | Status: DC | PRN
  Administered 2012-04-16: 23 mL

## 2012-04-16 MED ORDER — SURGIFOAM 100 EX MISC
CUTANEOUS | Status: DC | PRN
  Administered 2012-04-16 (×2): via TOPICAL

## 2012-04-16 MED ORDER — BISACODYL 10 MG RE SUPP: 10.0000 mg | Freq: Every day | RECTAL | Status: DC | PRN

## 2012-04-16 MED ORDER — SODIUM CHLORIDE 0.9 % IV SOLN
INTRAVENOUS | Status: DC | PRN
  Administered 2012-04-16: 10:00:00 via INTRAVENOUS

## 2012-04-16 MED ORDER — MENTHOL 3 MG MT LOZG: 1.0000 | LOZENGE | OROMUCOSAL | Status: DC | PRN

## 2012-04-16 MED ORDER — FLEET ENEMA 7-19 GM/118ML RE ENEM: 1.0000 | ENEMA | Freq: Once | RECTAL | Status: AC | PRN

## 2012-04-16 MED ORDER — PROPOFOL 10 MG/ML IV BOLUS
INTRAVENOUS | Status: DC | PRN
  Administered 2012-04-16: 200 mg via INTRAVENOUS

## 2012-04-16 MED ORDER — SODIUM CHLORIDE 0.9 % IR SOLN
Status: DC | PRN
  Administered 2012-04-16: 08:00:00

## 2012-04-16 MED ORDER — ROCURONIUM BROMIDE 100 MG/10ML IV SOLN
INTRAVENOUS | Status: DC | PRN
  Administered 2012-04-16: 60 mg via INTRAVENOUS
  Administered 2012-04-16 (×2): 20 mg via INTRAVENOUS

## 2012-04-16 MED ORDER — SENNA 8.6 MG PO TABS
1.0000 | ORAL_TABLET | Freq: Two times a day (BID) | ORAL | Status: DC
  Administered 2012-04-16 – 2012-04-19 (×7): 8.6 mg via ORAL
  Filled 2012-04-16 (×9): qty 1

## 2012-04-16 MED ORDER — SODIUM CHLORIDE 0.9 % IV SOLN: 250.0000 mL | INTRAVENOUS | Status: DC

## 2012-04-16 MED ORDER — HEMOSTATIC AGENTS (NO CHARGE) OPTIME
TOPICAL | Status: DC | PRN
  Administered 2012-04-16 (×2): 1 via TOPICAL

## 2012-04-16 MED ORDER — MIDAZOLAM HCL 5 MG/5ML IJ SOLN
INTRAMUSCULAR | Status: DC | PRN
  Administered 2012-04-16: 2 mg via INTRAVENOUS

## 2012-04-16 MED ORDER — SODIUM CHLORIDE 0.9 % IJ SOLN
3.0000 mL | Freq: Two times a day (BID) | INTRAMUSCULAR | Status: DC
  Administered 2012-04-17 – 2012-04-18 (×3): 3 mL via INTRAVENOUS

## 2012-04-16 MED ORDER — OXYCODONE-ACETAMINOPHEN 5-325 MG PO TABS
1.0000 | ORAL_TABLET | ORAL | Status: DC | PRN
  Administered 2012-04-16 (×2): 2 via ORAL
  Administered 2012-04-17: 1 via ORAL
  Administered 2012-04-17 – 2012-04-19 (×10): 2 via ORAL
  Filled 2012-04-16 (×14): qty 2
  Filled 2012-04-16: qty 1

## 2012-04-16 MED ORDER — ATORVASTATIN CALCIUM 40 MG PO TABS
40.0000 mg | ORAL_TABLET | Freq: Every day | ORAL | Status: DC
  Administered 2012-04-16 – 2012-04-18 (×3): 40 mg via ORAL
  Filled 2012-04-16 (×4): qty 1

## 2012-04-16 SURGICAL SUPPLY — 62 items
BAG DECANTER FOR FLEXI CONT (MISCELLANEOUS) ×2 IMPLANT
BENZOIN TINCTURE PRP APPL 2/3 (GAUZE/BANDAGES/DRESSINGS) ×2 IMPLANT
BLADE SURG ROTATE 9660 (MISCELLANEOUS) IMPLANT
BRUSH SCRUB EZ PLAIN DRY (MISCELLANEOUS) ×2 IMPLANT
BUR MATCHSTICK NEURO 3.0 LAGG (BURR) ×2 IMPLANT
CAGE CAPSTONE TELAMON 12X26 (Cage) ×2 IMPLANT
CANISTER SUCTION 2500CC (MISCELLANEOUS) ×2 IMPLANT
CAP LCK SPNE (Orthopedic Implant) ×2 IMPLANT
CAP LOCK SPINE RADIUS (Orthopedic Implant) ×2 IMPLANT
CAP LOCKING (Orthopedic Implant) ×2 IMPLANT
CLOTH BEACON ORANGE TIMEOUT ST (SAFETY) ×2 IMPLANT
CONT SPEC 4OZ CLIKSEAL STRL BL (MISCELLANEOUS) ×4 IMPLANT
COVER BACK TABLE 24X17X13 BIG (DRAPES) IMPLANT
COVER TABLE BACK 60X90 (DRAPES) ×2 IMPLANT
DECANTER SPIKE VIAL GLASS SM (MISCELLANEOUS) ×2 IMPLANT
DERMABOND ADVANCED (GAUZE/BANDAGES/DRESSINGS) ×1
DERMABOND ADVANCED .7 DNX12 (GAUZE/BANDAGES/DRESSINGS) ×1 IMPLANT
DRAPE C-ARM 42X72 X-RAY (DRAPES) ×4 IMPLANT
DRAPE LAPAROTOMY 100X72X124 (DRAPES) ×2 IMPLANT
DRAPE POUCH INSTRU U-SHP 10X18 (DRAPES) ×2 IMPLANT
DRAPE PROXIMA HALF (DRAPES) ×2 IMPLANT
DRAPE SURG 17X23 STRL (DRAPES) ×8 IMPLANT
ELECT REM PT RETURN 9FT ADLT (ELECTROSURGICAL) ×2
ELECTRODE REM PT RTRN 9FT ADLT (ELECTROSURGICAL) ×1 IMPLANT
EVACUATOR 1/8 PVC DRAIN (DRAIN) ×2 IMPLANT
GAUZE SPONGE 4X4 16PLY XRAY LF (GAUZE/BANDAGES/DRESSINGS) IMPLANT
GLOVE ECLIPSE 7.5 STRL STRAW (GLOVE) ×2 IMPLANT
GLOVE ECLIPSE 8.5 STRL (GLOVE) ×4 IMPLANT
GLOVE EXAM NITRILE LRG STRL (GLOVE) ×2 IMPLANT
GLOVE EXAM NITRILE MD LF STRL (GLOVE) IMPLANT
GLOVE EXAM NITRILE XL STR (GLOVE) IMPLANT
GLOVE EXAM NITRILE XS STR PU (GLOVE) IMPLANT
GLOVE INDICATOR 7.0 STRL GRN (GLOVE) ×2 IMPLANT
GLOVE SS BIOGEL STRL SZ 6.5 (GLOVE) ×3 IMPLANT
GLOVE SUPERSENSE BIOGEL SZ 6.5 (GLOVE) ×3
GOWN BRE IMP SLV AUR LG STRL (GOWN DISPOSABLE) ×2 IMPLANT
GOWN BRE IMP SLV AUR XL STRL (GOWN DISPOSABLE) ×4 IMPLANT
GOWN STRL REIN 2XL LVL4 (GOWN DISPOSABLE) IMPLANT
IMPLANT LEV 2 LORDOSED 30MM (Rod) ×2 IMPLANT
IMPLANT TRANSITIN 2LVL 34MM (Neuro Prosthesis/Implant) ×2 IMPLANT
KIT BASIN OR (CUSTOM PROCEDURE TRAY) ×2 IMPLANT
KIT ROOM TURNOVER OR (KITS) ×2 IMPLANT
MILL MEDIUM DISP (BLADE) ×2 IMPLANT
NEEDLE HYPO 22GX1.5 SAFETY (NEEDLE) ×2 IMPLANT
NS IRRIG 1000ML POUR BTL (IV SOLUTION) ×2 IMPLANT
PACK LAMINECTOMY NEURO (CUSTOM PROCEDURE TRAY) ×2 IMPLANT
SCREW PEDICLE 6.5MMX45MM (Screw) ×8 IMPLANT
SCREW PEDICLE 6.5X45 (Screw) ×4 IMPLANT
SPONGE GAUZE 4X4 12PLY (GAUZE/BANDAGES/DRESSINGS) ×2 IMPLANT
SPONGE SURGIFOAM ABS GEL 100 (HEMOSTASIS) ×2 IMPLANT
STRIP CLOSURE SKIN 1/2X4 (GAUZE/BANDAGES/DRESSINGS) ×4 IMPLANT
SUT VIC AB 0 CT1 18XCR BRD8 (SUTURE) ×2 IMPLANT
SUT VIC AB 0 CT1 8-18 (SUTURE) ×2
SUT VIC AB 2-0 CT1 18 (SUTURE) ×2 IMPLANT
SUT VIC AB 3-0 SH 8-18 (SUTURE) ×4 IMPLANT
SYR 20ML ECCENTRIC (SYRINGE) ×2 IMPLANT
TOWEL OR 17X24 6PK STRL BLUE (TOWEL DISPOSABLE) ×2 IMPLANT
TOWEL OR 17X26 10 PK STRL BLUE (TOWEL DISPOSABLE) ×2 IMPLANT
TRAY FOLEY CATH 14FRSI W/METER (CATHETERS) ×2 IMPLANT
WATER STERILE IRR 1000ML POUR (IV SOLUTION) ×2 IMPLANT
WEDGE TANGENT 12X26MM ×2 IMPLANT
globus transition cap (Cap) ×8 IMPLANT

## 2012-04-16 NOTE — Transfer of Care (Signed)
Immediate Anesthesia Transfer of Care Note  Patient: Jon Hall  Procedure(s) Performed: Procedure(s) with comments: POSTERIOR LUMBAR FUSION 1 LEVEL (N/A) - posterior lumbar fusion one to three,left two-three decompression  Patient Location: PACU  Anesthesia Type:General  Level of Consciousness: awake and sedated  Airway & Oxygen Therapy: Patient Spontanous Breathing and Patient connected to face mask oxygen  Post-op Assessment: Report given to PACU RN, Post -op Vital signs reviewed and stable and Patient moving all extremities  Post vital signs: Reviewed and stable  Complications: No apparent anesthesia complications

## 2012-04-16 NOTE — H&P (Signed)
Jon Hall is an 42 y.o. male.   Chief Complaint: Back pain HPI: 42 year old male status post previous L3-S1 fusions presents with worsening back pain and bilateral lower extremity symptoms consistent with neurogenic claudication. Workup demonstrates evidence of progressive disc space breakdown at L2-3 with disc herniation bilaterally and significant spinal stenosis. Patient's failed conservative management and presents now for lumbar decompression and fusion.  Past Medical History  Diagnosis Date  . Hypertension   . Diabetes mellitus without complication     Past Surgical History  Procedure Laterality Date  . Back surgery      4 times  . Arthroscopic knee Bilateral     History reviewed. No pertinent family history. Social History:  reports that he quit smoking about 2 years ago. His smoking use included Cigarettes. He has a 5 pack-year smoking history. He does not have any smokeless tobacco history on file. He reports that he does not drink alcohol or use illicit drugs.  Allergies: No Known Allergies  Medications Prior to Admission  Medication Sig Dispense Refill  . fenofibrate 160 MG tablet Take 160 mg by mouth daily.      Marland Kitchen glimepiride (AMARYL) 4 MG tablet Take 4 mg by mouth daily before breakfast.      . insulin aspart protamine-insulin aspart (NOVOLOG 70/30) (70-30) 100 UNIT/ML injection Inject 35-42 Units into the skin 2 (two) times daily with a meal.      . lisinopril (PRINIVIL,ZESTRIL) 40 MG tablet Take 40 mg by mouth daily.      . metFORMIN (GLUMETZA) 1000 MG (MOD) 24 hr tablet Take 1,000 mg by mouth 2 (two) times daily with a meal.      . Omega-3 Fatty Acids (FISH OIL) 1000 MG CAPS Take 3,000 mg by mouth daily.      Marland Kitchen oxyCODONE-acetaminophen (PERCOCET/ROXICET) 5-325 MG per tablet Take 1 tablet by mouth every 4 (four) hours as needed for pain.      . rosuvastatin (CRESTOR) 20 MG tablet Take 20 mg by mouth daily.        Results for orders placed during the hospital  encounter of 04/16/12 (from the past 48 hour(s))  GLUCOSE, CAPILLARY     Status: Abnormal   Collection Time    04/16/12  6:30 AM      Result Value Range   Glucose-Capillary 152 (*) 70 - 99 mg/dL   No results found.  Review of Systems  Constitutional: Negative.   HENT: Negative.   Eyes: Negative.   Respiratory: Negative.   Cardiovascular: Negative.   Gastrointestinal: Negative.   Genitourinary: Negative.   Musculoskeletal: Negative.   Skin: Negative.   Neurological: Negative.   Endo/Heme/Allergies: Negative.   Psychiatric/Behavioral: Negative.     Blood pressure 135/84, pulse 72, temperature 98 F (36.7 C), resp. rate 18, SpO2 98.00%. Physical Exam  Constitutional: He is oriented to person, place, and time. He appears well-developed and well-nourished. No distress.  HENT:  Head: Normocephalic and atraumatic.  Right Ear: External ear normal.  Left Ear: External ear normal.  Nose: Nose normal.  Mouth/Throat: Oropharynx is clear and moist.  Eyes: Conjunctivae and EOM are normal. Pupils are equal, round, and reactive to light. Right eye exhibits no discharge. Left eye exhibits no discharge.  Neck: Normal range of motion. Neck supple. No tracheal deviation present. No thyromegaly present.  Cardiovascular: Normal rate, regular rhythm, normal heart sounds and intact distal pulses.   No murmur heard. Respiratory: Effort normal and breath sounds normal. No respiratory distress. He has  no wheezes.  GI: Soft. Bowel sounds are normal. He exhibits no distension. There is no tenderness.  Musculoskeletal: Normal range of motion. He exhibits no edema and no tenderness.  Neurological: He is alert and oriented to person, place, and time. He has normal reflexes. He displays normal reflexes. No cranial nerve deficit. He exhibits normal muscle tone. Coordination normal.  Skin: Skin is warm and dry. No rash noted. He is not diaphoretic. No erythema. No pallor.  Psychiatric: He has a normal mood  and affect. His behavior is normal. Judgment and thought content normal.     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 Status post previous L3-S1 fusion. Plan L2-3 decompressive laminectomy with bilateral L2 and L3 decompressive foraminotomies followed by posterior lumbar interbody fusion utilizing tangent interbody allograft wedge Telamon interbody peek cage and local autograft. This will be coupled with posterior lateral arthrodesis utilizing nonsegmental pedicle screw instrumentation and local autograft at L2-3. Because of the patient's history of multiple episodes of adjacent level breakdown and his long fusion segment below L1-L2 and early evidence of disc degeneration at L1-L2 plan is to perform L1-L2 posterior lateral arthrodesis  utilizing dynamic instrumentation. Risks and benefits been explained. Patient wishes to proceed. POOL,HENRY A 04/16/2012, 7:41 AM

## 2012-04-16 NOTE — Anesthesia Procedure Notes (Signed)
Date/Time: 04/16/2012 7:54 AM Performed by: Coralee Rud Pre-anesthesia Checklist: Patient identified, Emergency Drugs available, Suction available, Patient being monitored and Timeout performed Patient Re-evaluated:Patient Re-evaluated prior to inductionOxygen Delivery Method: Circle system utilized Preoxygenation: Pre-oxygenation with 100% oxygen Intubation Type: IV induction Ventilation: Mask ventilation without difficulty and Oral airway inserted - appropriate to patient size Laryngoscope Size: Miller and 3 Grade View: Grade II Tube type: Oral Tube size: 8.0 mm Number of attempts: 1 Airway Equipment and Method: Stylet Placement Confirmation: ETT inserted through vocal cords under direct vision,  positive ETCO2 and breath sounds checked- equal and bilateral Secured at: 23 cm Tube secured with: Tape Dental Injury: Teeth and Oropharynx as per pre-operative assessment

## 2012-04-16 NOTE — Preoperative (Signed)
Beta Blockers   Reason not to administer Beta Blockers:Not Applicable 

## 2012-04-16 NOTE — Op Note (Signed)
Date of procedure: 04/16/2012  Date of dictation: Same  Service: Neurosurgery  Preoperative diagnosis: L2-3 herniated nucleus pulposus/stenosis with neurogenic claudication involving bilateral L2 and L3 nerve roots. Patient status post L3-S1 decompression and fusion with instrumentation.  Postoperative diagnosis: Same  Procedure Name: Reexploration of L2-3 laminectomy with bilateral complete L2-3 decompressive laminectomy and bilateral L2 and L3 decompressive foraminotomies, more than would be required for simple interbody fusion alone.  L2-3 posterior lumbar interbody fusion with tangent interbody allograft wedge, Telamon interbody peek cage and morselized autograft.  L1, L2, L3 posterior lateral arthrodesis utilizing segmental pedicle screw instrumentation and local autograft. (L1-L2 dynamic rod)  Reexploration of lumbar fusion with removal of hardware.  Surgeon:Henry A.Pool, M.D.  Asst. Surgeon: Gerlene Fee  Anesthesia: General  Indication: 42 year old male status post previous L3-4, L4-5 and L5-S1 decompression and fusions remotely in the past. He presents now with worsening back and bilateral lower extremity symptoms failing conservative management. Workup demonstrates evidence of marked disc space collapse with some mild retrolisthesis and a bilateral disc herniation causing marked spinal stenosis and severe neuroforaminal narrowing. Patient is been casted as to his options. He is failed conservative management. He said proceed with operative decompression infusion at L2-3. Given the patient's propensity to developing adjacent level disease on numerous occasions and some early degeneingration the L1-2 level I discussed with him the possibility of undergoing L1 to posterior lateral arthrodesis utilizing dynamic instrumentation to hopefully buttress this level and prevent adjacent level disease. Patient is aware the risks and benefits and wishes to proceed.  Operative note:After induction  of anesthesia, patient positioned prone onto Wilson frame and appropriately padded. Lumbar region prepped and draped. Incision made extending from L1 down to L4. Supper off dissection performed so the lamina facet joints of L1-L2 and L3. Deep self-retaining traction placed intraoperative fluoroscopy used levels were confirmed. Previous pedicle screw station L3-4 was dissected free and disassembled. Screws at L4 were removed. Fusion at L3-4 was fully inspected and found to be solid. Decompressive laminectomies then performed at L2-3 utilizing Leksell rongeurs Kerrison years high-speed drill to remove the entire lamina of L2 anterior facets of L2 bilaterally superior facets of L3 bilaterally and residual lamina of L3 bilaterally. Ligament flavum and epidural scar were elevated and resected piecemeal fashion using Kerrison rongeurs. Wide decompressive foraminotomies in the form of course exiting L2 and L3 nerve roots bilaterally. Bilateral discectomies were then performed at L2-3. All elements of the disc herniations were resected. The spaces and distracted up to 12 mm. With the distractor left in place on the patient's left side thecal sac and nerve roots protected on the right side. The spaces and reamed and then cut with 12 mm tangent in. A 12 mm tangent wedges and packed into place recessed roughly 1-2 mm in the posterior cortical margin of L2. Distractors in the removed and patient's left side. Thecal sac and nerve respect on the left side. The space was again reamed and then cut with 12 mm tangent instruments. Soft tissue was removed and interspace. Morselize autograft and packed interspace for later fusion. A 12 x 26 mm Telamon cage packed with morselized autograft was then impacted in place and recessed roughly 1 mm from the posterior cortical margin of L2. Pedicles of L1 and L2 were then identified using surface landmarks and intraoperative fluoroscopy 2 partial bone overlying the pedicle was then removed  using high-speed drill the pedicles and probed using pedicle awl each pedicle awl track was then probed and found to be solidly  within bone. Each pedicle awl track was then tapped with a 5.50 m screw tap. Screw troubles probed and found to be solidly within bone. 6.5 x 45 mm globus pedicle screws were placed bilaterally at L1 and L2. Transverse processes of L1-2 and 3 were then decorticated using high-speed drill. Morselized autograft was packed posterior laterally for later fusion. A hybrid dynamic rod system was then measured and cut for placement over the L1-2-3 levels. Rod between L2 and L3 was rigid the dynamic instrumentation spanned L1 to. Locking caps and placed over the screws at L1 and L2 and L3. Locking caps were then all engaged with the construct or mild compression. Final images revealed good position bone graft and hardware proper upper level with normal lamina spine. Wound is irrigated one final time. Hemostasis muscles was achieved with the electrocautery. was and close in layers with Vicryl sutures. A medium Hemovac drain was left in epidural space. There were no apparent complications. Patient tolerated the procedure well and returned to the recovery room postop.

## 2012-04-16 NOTE — Brief Op Note (Signed)
04/16/2012  10:42 AM  PATIENT:  Conley Canal  42 y.o. male  PRE-OPERATIVE DIAGNOSIS:  stenosis  POST-OPERATIVE DIAGNOSIS:  stenosis  PROCEDURE:  Procedure(s) with comments: POSTERIOR LUMBAR FUSION 1 LEVEL (N/A) - posterior lumbar fusion one to three,left two-three decompression  SURGEON:  Surgeon(s) and Role:    * Temple Pacini, MD - Primary    * Reinaldo Meeker, MD - Assisting  PHYSICIAN ASSISTANT:   ASSISTANTS:    ANESTHESIA:   general  EBL:  Total I/O In: 3015 [I.V.:2150; Blood:365; IV Piggyback:500] Out: 875 [Urine:75; Blood:800]  BLOOD ADMINISTERED:none  DRAINS: (Medium) Hemovact drain(s) in the Epidural space with  Suction Open   LOCAL MEDICATIONS USED:  MARCAINE     SPECIMEN:  No Specimen  DISPOSITION OF SPECIMEN:  N/A  COUNTS:  YES  TOURNIQUET:  * No tourniquets in log *  DICTATION: .Dragon Dictation  PLAN OF CARE: Admit to inpatient   PATIENT DISPOSITION:  PACU - hemodynamically stable.   Delay start of Pharmacological VTE agent (>24hrs) due to surgical blood loss or risk of bleeding: yes

## 2012-04-16 NOTE — Progress Notes (Signed)
Orthopedic Tech Progress Note Patient Details:  Jon Hall 04-Jun-1970 [id number] Called [facility] for brace order. Patient ID: Jon Hall, male   DOB: 01/16/1971, 41 y.o.   MRN: [id number]   Jennye Moccasin 04/16/2012, 4:44 PM

## 2012-04-16 NOTE — Anesthesia Postprocedure Evaluation (Signed)
  Anesthesia Post-op Note  Patient: Jon Hall  Procedure(s) Performed: Procedure(s) with comments: POSTERIOR LUMBAR FUSION 1 LEVEL (N/A) - posterior lumbar fusion one to three,left two-three decompression  Patient Location: PACU  Anesthesia Type:General  Level of Consciousness: awake, alert  and oriented  Airway and Oxygen Therapy: Patient Spontanous Breathing  Post-op Pain: mild  Post-op Assessment: Post-op Vital signs reviewed, Patient's Cardiovascular Status Stable, Patent Airway and Pain level controlled  Post-op Vital Signs: stable  Complications: No apparent anesthesia complications

## 2012-04-16 NOTE — Anesthesia Preprocedure Evaluation (Addendum)
Anesthesia Evaluation  Patient identified by MRN, date of birth, ID band Patient awake    Reviewed: Allergy & Precautions, NPO status , Patient's Chart, lab work & pertinent test results  Airway Mallampati: II TM Distance: >3 FB     Dental  (+) Teeth Intact and Dental Advisory Given   Pulmonary neg pulmonary ROS, former smoker,  breath sounds clear to auscultation  Pulmonary exam normal       Cardiovascular hypertension, Pt. on medications Rhythm:Regular Rate:Normal     Neuro/Psych negative neurological ROS     GI/Hepatic negative GI ROS, Neg liver ROS,   Endo/Other  diabetes, Well ControlledCBG= 152  Renal/GU negative Renal ROS     Musculoskeletal   Abdominal (+) + obese,   Peds  Hematology negative hematology ROS (+)   Anesthesia Other Findings   Reproductive/Obstetrics                         Anesthesia Physical Anesthesia Plan  ASA: III  Anesthesia Plan: General   Post-op Pain Management:    Induction: Intravenous  Airway Management Planned: Oral ETT  Additional Equipment:   Intra-op Plan:   Post-operative Plan: Extubation in OR  Informed Consent: I have reviewed the patients History and Physical, chart, labs and discussed the procedure including the risks, benefits and alternatives for the proposed anesthesia with the patient or authorized representative who has indicated his/her understanding and acceptance.   Dental advisory given  Plan Discussed with: CRNA, Anesthesiologist and Surgeon  Anesthesia Plan Comments: (Type 2 DM glucose 169 Htn on lisinopril Obesity Former smoker   Plan GA  Kipp Brood, MD)       Anesthesia Quick Evaluation

## 2012-04-17 LAB — GLUCOSE, CAPILLARY
Glucose-Capillary: 86 mg/dL (ref 70–99)
Glucose-Capillary: 122 mg/dL — ABNORMAL HIGH (ref 70–99)
Glucose-Capillary: 168 mg/dL — ABNORMAL HIGH (ref 70–99)
Glucose-Capillary: 145 mg/dL — ABNORMAL HIGH (ref 70–99)
Glucose-Capillary: 134 mg/dL — ABNORMAL HIGH (ref 70–99)

## 2012-04-17 NOTE — Evaluation (Signed)
Physical Therapy Evaluation Patient Details Name: Jon Hall MRN: [id number] DOB: May 19, 1970 Today's Date: 04/17/2012 Time: 4098-1191 PT Time Calculation (min): 26 min  PT Assessment / Plan / Recommendation Clinical Impression  Pt is a 42 yo male s/p PLF x 3 levels mobilizing well however remains to have bilat thigh numbness. anticipate pt to return home with assist for family, DME, and HHPT.     PT Assessment  Patient needs continued PT services    Follow Up Recommendations  Home health PT;Supervision/Assistance - 24 hour    Does the patient have the potential to tolerate intense rehabilitation      Barriers to Discharge None      Equipment Recommendations   (3n1 commode)    Recommendations for Other Services     Frequency Min 5X/week    Precautions / Restrictions Precautions Precautions: Back Precaution Booklet Issued: Yes (comment) Precaution Comments: pt with verbal understanding  Required Braces or Orthoses: Spinal Brace Spinal Brace: Lumbar corset;Applied in sitting position Restrictions Weight Bearing Restrictions: No   Pertinent Vitals/Pain 3-4/10 pre- PT, 5/10 post PT at surgical site in back      Mobility  Bed Mobility Bed Mobility: Rolling Left;Left Sidelying to Sit Rolling Left: 6: Modified independent (Device/Increase time) Left Sidelying to Sit: 4: Min guard Details for Bed Mobility Assistance: verbal and tactile cues for log roll Transfers Transfers: Sit to Stand;Stand to Sit Sit to Stand: 4: Min assist;With upper extremity assist;From bed Stand to Sit: 4: Min assist;With upper extremity assist;To chair/3-in-1 Details for Transfer Assistance: tactile/verbal cues for safe hand placement, increased time Ambulation/Gait Ambulation/Gait Assistance: 4: Min guard Ambulation Distance (Feet): 100 Feet Assistive device: Rolling walker Ambulation/Gait Assistance Details: increased bilat UE WBing due to bilat LE numbness, pt took 4 standing rest  breaks, decreased R LE WBing Gait Pattern: Step-through pattern;Decreased stride length;Decreased step length - right;Wide base of support Gait velocity: slow Stairs: No    Exercises     PT Diagnosis: Difficulty walking;Acute pain  PT Problem List: Decreased strength;Decreased activity tolerance;Decreased balance;Decreased mobility;Decreased knowledge of use of DME;Decreased knowledge of precautions PT Treatment Interventions: DME instruction;Gait training;Stair training;Functional mobility training;Therapeutic activities;Therapeutic exercise   PT Goals Acute Rehab PT Goals PT Goal Formulation: With patient Time For Goal Achievement: 04/24/12 Potential to Achieve Goals: Good Pt will Roll Supine to Left Side: with modified independence PT Goal: Rolling Supine to Left Side - Progress: Goal set today Pt will go Supine/Side to Sit: with modified independence PT Goal: Supine/Side to Sit - Progress: Goal set today Pt will go Sit to Supine/Side: with modified independence PT Goal: Sit to Supine/Side - Progress: Goal set today Pt will go Sit to Stand: with modified independence;with upper extremity assist (up to RW.) PT Goal: Sit to Stand - Progress: Goal set today Pt will Ambulate: >150 feet;with supervision;with rolling walker PT Goal: Ambulate - Progress: Goal set today Pt will Go Up / Down Stairs: Flight;with min assist;with rail(s) PT Goal: Up/Down Stairs - Progress: Goal set today Additional Goals Additional Goal #1: pt independent with recall of 3/3 back precautions and 100% compliant. PT Goal: Additional Goal #1 - Progress: Goal set today  Visit Information  Last PT Received On: 04/17/12 Assistance Needed: +1    Subjective Data  Subjective: Pt received in L sidelying in bed agreeabel to PT.   Prior Functioning  Home Living Lives With: Family Available Help at Discharge: Family (wife took FMLA) Type of Home: House Home Access: Level entry Home Layout: Two level  Alternate  Level Stairs-Number of Steps: flight Alternate Level Stairs-Rails: Right Bathroom Shower/Tub: Health visitor: Standard Bathroom Accessibility: Yes How Accessible: Accessible via walker Home Adaptive Equipment: Walker - rolling Additional Comments: pt can stay on first floor but preferes to go up to his own room Prior Function Level of Independence: Independent Able to Take Stairs?: Yes Driving: Yes Vocation: Unemployed Communication Communication: No difficulties Dominant Hand: Right    Cognition  Cognition Overall Cognitive Status: Appears within functional limits for tasks assessed/performed Arousal/Alertness: Awake/alert Orientation Level: Appears intact for tasks assessed Behavior During Session: [facility] for tasks performed    Extremity/Trunk Assessment Right Upper Extremity Assessment RUE ROM/Strength/Tone: WFL for tasks assessed RUE Sensation: WFL - Light Touch Left Upper Extremity Assessment LUE ROM/Strength/Tone: WFL for tasks assessed LUE Sensation: WFL - Light Touch Right Lower Extremity Assessment RLE ROM/Strength/Tone: Deficits RLE ROM/Strength/Tone Deficits: generalized wkns due to pain RLE Sensation: Deficits RLE Sensation Deficits: numbness in thigh Left Lower Extremity Assessment LLE ROM/Strength/Tone: Deficits LLE ROM/Strength/Tone Deficits: generalized wkns due to back pain LLE Sensation: Deficits LLE Sensation Deficits: numbness at thigh Trunk Assessment Trunk Assessment: Normal   Balance    End of Session PT - End of Session Equipment Utilized During Treatment: Gait belt;Back brace Activity Tolerance: Patient tolerated treatment well Patient left: in chair;with call bell/phone within reach Nurse Communication: Mobility status  GP     Marcene Brawn 04/17/2012, 11:23 AM  Lewis Shock, PT, DPT Pager #: [phone] Office #: [phone]

## 2012-04-17 NOTE — Progress Notes (Signed)
Patient ID: Jon Hall, male   DOB: 09-Jan-1971, 42 y.o.   MRN: [id number] C/o of some numbness in both anterior thighs. Oob. No pain down to legs. Foley out. hemovac with some drainage.

## 2012-04-17 NOTE — Plan of Care (Signed)
Problem: Consults Goal: Diagnosis - Spinal Surgery Thoraco/Lumbar Spine Fusion     

## 2012-04-17 NOTE — Progress Notes (Signed)
Occupational Therapy Evaluation Patient Details Name: Jon Hall MRN: [id number] DOB: 03/17/1970 Today's Date: 04/17/2012 Time: 1519-     OT Assessment / Plan / Recommendation Clinical Impression  42 yo s/p PLIF. Back brace. Pt will have 24/7 S afer D/C. Pt will benefit from skilled OT services to facilitate D/C to home due to below deficits.    OT Assessment  Patient needs continued OT Services    Follow Up Recommendations  No OT follow up    Barriers to Discharge None    Equipment Recommendations  3 in 1 bedside comode    Recommendations for Other Services    Frequency  Min 2X/week    Precautions / Restrictions Precautions Precautions: Back Precaution Booklet Issued: Yes (comment) Precaution Comments: pt with verbal understanding  Required Braces or Orthoses: Spinal Brace Spinal Brace: Lumbar corset;Applied in sitting position Restrictions Weight Bearing Restrictions: No   Pertinent Vitals/Pain 5.back    ADL  Grooming: Supervision/safety Where Assessed - Grooming: Unsupported standing Upper Body Bathing: Set up;Supervision/safety Where Assessed - Upper Body Bathing: Supported sitting Lower Body Bathing: Moderate assistance Where Assessed - Lower Body Bathing: Supported sit to stand Upper Body Dressing: Minimal assistance Where Assessed - Upper Body Dressing: Supported sitting Lower Body Dressing: Moderate assistance Where Assessed - Lower Body Dressing: Supported sit to Pharmacist, hospital: Minimal Dentist Method: Sit to Barista: Bedside commode Toileting - Clothing Manipulation and Hygiene: Moderate assistance Where Assessed - Toileting Clothing Manipulation and Hygiene: Sit to stand from 3-in-1 or toilet Equipment Used: Back brace;Gait belt;Rolling walker;Sock aid;Reacher Transfers/Ambulation Related to ADLs: minguard ADL Comments: Began education on back precautions with use of AE. Pt unable to cross legs for  LB ADL. discussed use of AE for toileting.    OT Diagnosis: Generalized weakness;Acute pain  OT Problem List: Decreased activity tolerance;Decreased knowledge of use of DME or AE;Decreased knowledge of precautions;Pain OT Treatment Interventions: Self-care/ADL training;DME and/or AE instruction;Therapeutic activities;Patient/family education   OT Goals Acute Rehab OT Goals OT Goal Formulation: With patient Time For Goal Achievement: 04/24/12 Potential to Achieve Goals: Good ADL Goals Additional ADL Goal #1: Pt will demonstrate back precautions during aDL with S ADL Goal: Additional Goal #1 - Progress: Goal set today Additional ADL Goal #2: Pt will complete functional mobility for ADL @ mod I level with RW ADL Goal: Additional Goal #2 - Progress: Goal set today  Visit Information  Last OT Received On: 04/17/12 Assistance Needed: +1    Subjective Data      Prior Functioning     Home Living Lives With: Family Available Help at Discharge: Available 24 hours/day Type of Home: House Home Access: Level entry Home Layout: Two level Alternate Level Stairs-Number of Steps: flight Alternate Level Stairs-Rails: Right Bathroom Shower/Tub: Health visitor: Standard Bathroom Accessibility: Yes How Accessible: Accessible via walker Home Adaptive Equipment: Walker - rolling Additional Comments: pt can stay on first floor but preferes to go up to his own room (pt unsure if he has RW) Prior Function Level of Independence: Independent Able to Take Stairs?: Yes Driving: Yes Vocation: Unemployed         Vision/Perception     Cognition  Cognition Overall Cognitive Status: Appears within functional limits for tasks assessed/performed Arousal/Alertness: Awake/alert Orientation Level: Appears intact for tasks assessed Behavior During Session: [facility] for tasks performed    Extremity/Trunk Assessment Right Upper Extremity Assessment RUE ROM/Strength/Tone: [facility] for tasks  assessed Left Upper Extremity Assessment LUE ROM/Strength/Tone: [facility] for  tasks assessed Right Lower Extremity Assessment RLE ROM/Strength/Tone: Deficits RLE ROM/Strength/Tone Deficits: generalized wkns due to pain RLE Sensation: Deficits RLE Sensation Deficits: numbness in thigh Left Lower Extremity Assessment LLE ROM/Strength/Tone: Deficits LLE ROM/Strength/Tone Deficits: generalized wkns due to back pain LLE Sensation: Deficits LLE Sensation Deficits: numbness at thigh Trunk Assessment Trunk Assessment: Normal     Mobility Bed Mobility Bed Mobility: Right Sidelying to Sit Right Sidelying to Sit: 6: Modified independent (Device/Increase time) Details for Bed Mobility Assistance: able to demonstrate log rolling independently Transfers Transfers: Sit to Stand;Stand to Sit Sit to Stand: 4: Min guard Stand to Sit: 4: Min guard Details for Transfer Assistance: tactile/verbal cues for safe hand placement, increased time     Exercise     Balance  WFL   End of Session OT - End of Session Equipment Utilized During Treatment: Gait belt;Back brace Activity Tolerance: Patient tolerated treatment well Patient left: in chair;with call bell/phone within reach Nurse Communication: Mobility status  GO     Loran Fleet,HILLARY 04/17/2012, 5:08 PM [facility], OTR/L  [phone] 04/17/2012

## 2012-04-17 NOTE — Progress Notes (Signed)
Patient foley removed at 0600. Fluids encouraged. Patient informed he would need to void by early afternoon.  Patient ambulated also at this time around the bed with brace and walker. Pt c/o numbness to both legs above the knees when he sat up to ambulate. Will continue to monitor.

## 2012-04-18 LAB — GLUCOSE, CAPILLARY
Glucose-Capillary: 125 mg/dL — ABNORMAL HIGH (ref 70–99)
Glucose-Capillary: 134 mg/dL — ABNORMAL HIGH (ref 70–99)

## 2012-04-18 MED ORDER — FLEET ENEMA 7-19 GM/118ML RE ENEM: 1.0000 | ENEMA | Freq: Every day | RECTAL | Status: DC | PRN

## 2012-04-18 NOTE — Progress Notes (Signed)
Patient ID: Jon Hall, male   DOB: [birth year], 42 y.o.   MRN: [id number] Subjective: Patient reports thigh numbness but no weakness or pain  Objective: Vital signs in last 24 hours: Temp:  [98.1 F (36.7 C)-99.8 F (37.7 C)] 99.2 F (37.3 C) (03/16 0600) Pulse Rate:  [66-97] 85 (03/16 0600) Resp:  [16-20] 17 (03/16 0600) BP: (116-133)/(65-80) 133/80 mmHg (03/16 0600) SpO2:  [96 %-99 %] 98 % (03/16 0600)  Intake/Output from previous day: 03/15 0701 - 03/16 0700 In: 203 [P.O.:200; I.V.:3] Out: 2675 [Urine:2425; Drains:250] Intake/Output this shift:    Wound:clean and dry  Lab Results: No results found for this basename: WBC, HGB, HCT, PLT,  in the last 72 hours BMET No results found for this basename: NA, K, CL, CO2, GLUCOSE, BUN, CREATININE, CALCIUM,  in the last 72 hours  Studies/Results: Dg Lumbar Spine 2-3 Views  04/16/2012  *RADIOLOGY REPORT*  Clinical Data: L1-3 fusion.  DG C-ARM 61-120 MIN,LUMBAR SPINE - 2-3 VIEW  Technique: Two fluoroscopic intraoperative spot views of the upper lumbar spine are provided.  Comparison:  Plain films lumbar spine 08/08/2009.  Findings: Images demonstrate pedicle screws and stabilization bars from L1-L3.  Interbody spacer at L2-3 is identified.  IMPRESSION: L1-3 fusion in process.   Original Report Authenticated By: Holley Dexter, M.D.    Dg C-arm [phone] Min  04/16/2012  *RADIOLOGY REPORT*  Clinical Data: L1-3 fusion.  DG C-ARM 61-120 MIN,LUMBAR SPINE - 2-3 VIEW  Technique: Two fluoroscopic intraoperative spot views of the upper lumbar spine are provided.  Comparison:  Plain films lumbar spine 08/08/2009.  Findings: Images demonstrate pedicle screws and stabilization bars from L1-L3.  Interbody spacer at L2-3 is identified.  IMPRESSION: L1-3 fusion in process.   Original Report Authenticated By: Holley Dexter, M.D.     Assessment/Plan: Doing well. Feels he needs one more day. Drain removed. Plan d/c tomorrow.   LOS: 2 days  as  above   Reinaldo Meeker, MD 04/18/2012, 9:08 AM

## 2012-04-18 NOTE — Progress Notes (Signed)
Physical Therapy Treatment Patient Details Name: Jon Hall MRN: [id number] DOB: 1970-11-21 Today's Date: 04/18/2012 Time:  -     PT Assessment / Plan / Recommendation Comments on Treatment Session  Pt very pleasant & willing to participate.   Reports pain as 5/10 in back & Lt LE numbness from hip-->knee.   Began stair training this session but will need to practice again before d/cing home.       Follow Up Recommendations  Home health PT;Supervision/Assistance - 24 hour     Does the patient have the potential to tolerate intense rehabilitation     Barriers to Discharge        Equipment Recommendations   (3-in-1 commode)    Recommendations for Other Services    Frequency Min 5X/week   Plan Discharge plan remains appropriate    Precautions / Restrictions Precautions Precautions: Back Precaution Comments: pt with verbal understanding  Required Braces or Orthoses: Spinal Brace Spinal Brace: Lumbar corset;Applied in sitting position Restrictions Weight Bearing Restrictions: No   Pertinent Vitals/Pain 5/10 incisional pain + Lt LE numbness    Mobility  Bed Mobility Bed Mobility: Right Sidelying to Sit;Sitting - Scoot to Edge of Bed;Sit to Sidelying Right Right Sidelying to Sit: 6: Modified independent (Device/Increase time);With rails;HOB flat Sitting - Scoot to Edge of Bed: 6: Modified independent (Device/Increase time) Sit to Sidelying Right: 4: Min assist;HOB flat Details for Bed Mobility Assistance: (A) to lift LE's back into bed.  Pt did well with maintaining back precautions Transfers Transfers: Sit to Stand;Stand to Sit Sit to Stand: 5: Supervision;With upper extremity assist;From bed Stand to Sit: 5: Supervision;With upper extremity assist;To bed Details for Transfer Assistance: Cues for safest hand placement.   Ambulation/Gait Ambulation/Gait Assistance: 4: Min guard Ambulation Distance (Feet): 150 Feet Assistive device: Rolling walker Ambulation/Gait  Assistance Details: Cues for tall posture.  Pt cont's to rely heavily on RW with UE's due to Lt LE numbness.   Gait Pattern: Step-through pattern;Decreased stride length;Decreased weight shift to left Gait velocity: slow Stairs: Yes Stairs Assistance: 4: Min guard Stairs Assistance Details (indicate cue type and reason): Cues for "no twisting" & technique.  With rail being on Rt side, it caused pt to step up with Lt LE ("weak") & step down with Rt LE ("strong") rather than up with "strong"/down with "weak", but pt tolerated with min c/o pain however did rely heavily on UE supported on rail.   Pt attempted forwards technique with Rt rail + HHA but pt reports to painful to cont with that technique at this time.   Stair Management Technique: One rail Right;Sideways Number of Stairs: 5 Wheelchair Mobility Wheelchair Mobility: No      PT Goals Acute Rehab PT Goals Time For Goal Achievement: 04/24/12 Potential to Achieve Goals: Good Pt will Roll Supine to Left Side: with modified independence Pt will go Supine/Side to Sit: with modified independence PT Goal: Supine/Side to Sit - Progress: Met Pt will go Sit to Supine/Side: with modified independence PT Goal: Sit to Supine/Side - Progress: Progressing toward goal Pt will go Sit to Stand: with modified independence;with upper extremity assist PT Goal: Sit to Stand - Progress: Progressing toward goal Pt will Ambulate: >150 feet;with supervision;with rolling walker PT Goal: Ambulate - Progress: Progressing toward goal Pt will Go Up / Down Stairs: Flight;with min assist;with rail(s) PT Goal: Up/Down Stairs - Progress: Progressing toward goal Additional Goals Additional Goal #1: pt independent with recall of 3/3 back precautions and 100% compliant. PT Goal:  Additional Goal #1 - Progress: Progressing toward goal  Visit Information  Last PT Received On: 04/18/12 Assistance Needed: +1    Subjective Data      Cognition  Cognition Overall  Cognitive Status: Appears within functional limits for tasks assessed/performed Arousal/Alertness: Awake/alert Orientation Level: Appears intact for tasks assessed Behavior During Session: [facility] for tasks performed    Balance     End of Session PT - End of Session Equipment Utilized During Treatment: Back brace Activity Tolerance: Patient tolerated treatment well Patient left: in bed;with call bell/phone within reach Nurse Communication: Mobility status     Verdell Face, [place] [phone] 04/18/2012

## 2012-04-19 LAB — GLUCOSE, CAPILLARY
Glucose-Capillary: 118 mg/dL — ABNORMAL HIGH (ref 70–99)
Glucose-Capillary: 66 mg/dL — ABNORMAL LOW (ref 70–99)

## 2012-04-19 MED ORDER — HYDROXYZINE HCL 25 MG PO TABS: 50.0000 mg | ORAL_TABLET | ORAL | Status: DC | PRN

## 2012-04-19 MED ORDER — HYDROXYZINE HCL 50 MG PO TABS: 50.0000 mg | ORAL_TABLET | ORAL | Status: DC | PRN

## 2012-04-19 MED ORDER — DIAZEPAM 5 MG PO TABS: 5.0000 mg | ORAL_TABLET | Freq: Four times a day (QID) | ORAL | Status: DC | PRN

## 2012-04-19 MED ORDER — OXYCODONE-ACETAMINOPHEN 5-325 MG PO TABS: 1.0000 | ORAL_TABLET | ORAL | Status: AC | PRN

## 2012-04-19 MED FILL — Sodium Chloride IV Soln 0.9%: INTRAVENOUS | Qty: 1000 | Status: AC

## 2012-04-19 MED FILL — Heparin Sodium (Porcine) Inj 1000 Unit/ML: INTRAMUSCULAR | Qty: 30 | Status: AC

## 2012-04-19 NOTE — Progress Notes (Signed)
Physical Therapy Treatment Patient Details Name: Jon Hall MRN: [id number] DOB: 16-Jan-1971 Today's Date: 04/19/2012 Time: 4098-1191 PT Time Calculation (min): 23 min  PT Assessment / Plan / Recommendation Comments on Treatment Session  Pt pleasant and willing to participate.  Reports pain at 3/10.  Pt able to ambulate with minimal cuing, forward flexion at hip increases with fatigue.  Pt able to climb 6 stairs at one time facing forward with one HR support, based on facial expression this method increases pain level.  Pt states he is able to stay on main level at first when d/c'd home.      Follow Up Recommendations  Home health PT;Supervision/Assistance - 24 hour     Does the patient have the potential to tolerate intense rehabilitation     Barriers to Discharge        Equipment Recommendations  Other (comment) (3n1 commode)    Recommendations for Other Services    Frequency Min 5X/week   Plan Discharge plan remains appropriate    Precautions / Restrictions Precautions Precautions: Back Precaution Comments: Pt verbalized 3/3 back precautions. Required Braces or Orthoses: Spinal Brace Spinal Brace: Lumbar corset;Applied in sitting position Restrictions Weight Bearing Restrictions: No   Pertinent Vitals/Pain Pt reports pain at 3/10.    Mobility  Bed Mobility Bed Mobility: Not assessed Transfers Transfers: Sit to Stand;Stand to Sit Sit to Stand: 5: Supervision;With upper extremity assist;From bed;From elevated surface Stand to Sit: 5: Supervision;With upper extremity assist;To elevated surface;To bed Details for Transfer Assistance: Cues for hand placement.  Bed surface slightly elevated to assist. Ambulation/Gait Ambulation/Gait Assistance: 5: Supervision Ambulation Distance (Feet): 300 Feet Assistive device: Rolling walker Ambulation/Gait Assistance Details: Cues for tall posture and to relax shoulders.   Gait Pattern: Step-through pattern Stairs: Yes Stairs  Assistance: 4: Min guard Stairs Assistance Details (indicate cue type and reason): Pt able to demonstrate forward technique with minimal verbal cues to maintain back precautions.   Stair Management Technique: One rail Right;Step to pattern;Forwards Number of Stairs: 9 Wheelchair Mobility Wheelchair Mobility: No           PT Goals Acute Rehab PT Goals Time For Goal Achievement: 04/24/12 Potential to Achieve Goals: Good Pt will go Sit to Stand: with modified independence;with upper extremity assist PT Goal: Sit to Stand - Progress: Progressing toward goal Pt will Ambulate: >150 feet;with supervision;with rolling walker PT Goal: Ambulate - Progress: Progressing toward goal Pt will Go Up / Down Stairs: Flight;with min assist;with rail(s) PT Goal: Up/Down Stairs - Progress: Progressing toward goal Additional Goals Additional Goal #1: pt independent with recall of 3/3 back precautions and 100% compliant. PT Goal: Additional Goal #1 - Progress: Progressing toward goal  Visit Information  Last PT Received On: 04/19/12 Assistance Needed: +1    Subjective Data  Subjective: Pt received sitting edge of bed; agreeable to PT.   Cognition  Cognition Overall Cognitive Status: Appears within functional limits for tasks assessed/performed Arousal/Alertness: Awake/alert Orientation Level: Appears intact for tasks assessed Behavior During Session: [facility] for tasks performed    Balance     End of Session PT - End of Session Equipment Utilized During Treatment: Gait belt;Back brace Activity Tolerance: Patient tolerated treatment well Patient left: in bed;with call bell/phone within reach;with family/visitor present Nurse Communication: Mobility status   GP     Enid Baas, SPTA 04/19/2012, 2:14 PM   Verdell Face, PTA [phone] 04/19/2012

## 2012-04-19 NOTE — Progress Notes (Signed)
04/19/12 Per Dr. Newell Coral, HHPT not needed. [facility] delivered rolling walker and 3N1 to patient prior to d/c. Patient lives with wife who can assist prn. Jacquelynn Cree RN, BSN, CCM

## 2012-04-19 NOTE — Discharge Summary (Signed)
  Physician Discharge Summary  Patient ID: Jon Hall MRN: [id number] DOB/AGE: 44-Sep-1972 44 y.o.  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 Jordan Likes, who performed an L2-3 lumbar decompression and fusion. Postoperatively the patient made steady progress. He's been working with PT and OT. His incision is healing nicely. He is up and ambulate actively. He has a 3 in one and rolling walker. He is being discharged to home with instructions regarding wound care and activities (that were given to both he and his wife). He is to followup with Dr. Jordan Likes in 2-3 weeks, and to call the office tomorrow for an appointment.  Discharge Exam: Blood pressure 133/88, pulse 111, temperature 98.9 F (37.2 C), temperature source Oral, resp. rate 20, height 6' (1.829 m), weight 120.203 kg (265 lb), SpO2 97.00%.  Disposition: Home     Medication List    TAKE these medications       diazepam 5 MG tablet  Commonly known as:  VALIUM  Take 1 tablet (5 mg total) by mouth every 6 (six) hours as needed (Muscle spasms).     fenofibrate 160 MG tablet  Take 160 mg by mouth daily.     Fish Oil 1000 MG Caps  Take 3,000 mg by mouth daily.     glimepiride 4 MG tablet  Commonly known as:  AMARYL  Take 4 mg by mouth daily before breakfast.     hydrOXYzine 50 MG tablet  Commonly known as:  ATARAX/VISTARIL  Take 1 tablet (50 mg total) by mouth every 4 (four) hours as needed (Nausea).     insulin aspart protamine-insulin aspart (70-30) 100 UNIT/ML  injection  Commonly known as:  NOVOLOG 70/30  Inject 35-42 Units into the skin 2 (two) times daily with a meal. 35 units with breakfast, 42 units with supper     lisinopril 40 MG tablet  Commonly known as:  PRINIVIL,ZESTRIL  Take 40 mg by mouth daily.     metFORMIN 1000 MG (MOD) 24 hr tablet  Commonly known as:  GLUMETZA  Take 1,000 mg by mouth 2 (two) times daily with a meal.     oxyCODONE-acetaminophen 5-325 MG per tablet  Commonly known as:  PERCOCET/ROXICET  Take 1-2 tablets by mouth every 4 (four) hours as needed for pain.     oxyCODONE-acetaminophen 5-325 MG per tablet  Commonly known as:  PERCOCET/ROXICET  Take 1 tablet by mouth every 4 (four) hours as needed for pain.     rosuvastatin 20 MG tablet  Commonly known as:  CRESTOR  Take 20 mg by mouth daily.         Signed: Hewitt Shorts, MD 04/19/2012, 3:42 PM

## 2012-05-17 NOTE — OR Nursing (Signed)
Wound classification added

## 2014-05-02 ENCOUNTER — Ambulatory Visit: Payer: Self-pay

## 2014-05-09 ENCOUNTER — Ambulatory Visit: Payer: Self-pay

## 2014-05-10 ENCOUNTER — Other Ambulatory Visit: Payer: Self-pay

## 2014-05-10 NOTE — Patient Outreach (Signed)
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 Happens sporadically- not necessarily more at a particular time of the day. He has not spoke to any of his physicians about it. States that MD increased his insulin as a result of his last A1C- thinks his last result was 7.6%.  States he's having occasional low blood sugars through the night (1 in 2 weeks)- says he over treats it because he feels so bad and it doesn't come up fast enough.  Uses peanut butter crackers and often uses two packs. Says he sometimes takes his insulin unrelated to the mealtime- not sure that is the time he gets low i the middle of the night.   Objective: Blood sugar meter reveals blood sugars between 71-138 mg/dl.  Current Medications:  Current Outpatient Prescriptions  Medication Sig Dispense Refill  . amLODipine (NORVASC) 5 MG tablet Take 5 mg by mouth daily.    . dapagliflozin propanediol (FARXIGA) 5 MG TABS tablet Take 5 mg by mouth daily.    . fenofibrate 160 MG tablet Take 160 mg by mouth daily.    Marland Kitchen glimepiride (AMARYL) 4 MG tablet Take 4 mg by mouth daily before breakfast.    . insulin aspart protamine-insulin aspart (NOVOLOG 70/30) (70-30) 100 UNIT/ML injection Inject 36-60 Units into the skin 2 (two) times daily with a meal. 36 units with breakfast, 60 units with supper    . lisinopril (PRINIVIL,ZESTRIL) 40 MG tablet Take 40 mg by mouth daily.    . metFORMIN (GLUMETZA) 1000 MG (MOD) 24 hr tablet Take 1,000 mg by mouth 2 (two) times daily with a meal.    . omega-3 acid ethyl esters (LOVAZA) 1 G capsule Take 1,000 mg by mouth 2 (two) times daily. Patient has only been taking 3 tablets per day    . oxyCODONE-acetaminophen (PERCOCET/ROXICET) 5-325 MG per tablet Take 1-2 tablets by mouth every 4 (four) hours as needed for pain. 60 tablet 0  .  rosuvastatin (CRESTOR) 20 MG tablet Take 20 mg by mouth daily.    . diazepam (VALIUM) 5 MG tablet Take 1 tablet (5 mg total) by mouth every 6 (six) hours as needed (Muscle spasms). (Patient not taking: Reported on 05/10/2014) 30 tablet 0  . hydrOXYzine (ATARAX/VISTARIL) 50 MG tablet Take 1 tablet (50 mg total) by mouth every 4 (four) hours as needed (Nausea). (Patient not taking: Reported on 05/10/2014) 30 tablet 0  . Omega-3 Fatty Acids (FISH OIL) 1000 MG CAPS Take 3,000 mg by mouth daily.    Marland Kitchen oxyCODONE-acetaminophen (PERCOCET/ROXICET) 5-325 MG per tablet Take 1 tablet by mouth every 4 (four) hours as needed for pain.     No current facility-administered medications for this visit.    Functional Status:  In your present state of health, do you have any difficulty performing the following activities: 05/10/2014  Is the patient deaf or have difficulty hearing? N  Hearing N  Difficulty concentrating or making decisions Y  Walking or climbing stairs? Y  Doing errands, shopping? N    Fall/Depression Screening: PHQ 2/9 Scores 05/10/2014  PHQ - 2 Score 0   Appears to have low blood sugars when he poorly times his insulin.  He is not consistent about keeping his insulin with him if they go out for dinner and then forgets to take it before he eats.  Over-treating his low  blood sugars causing high blood sugars.   [facility]        Patient Outreach from 05/10/2014 in [facility] Link To Wellness   Care Plan Problem One  Elevated A1C   Care Plan for Problem One  Active   Interventions for Problem One Long Term Goal  Take insulin 5-10 minutes before eating supper and breakfast.  Eat a bedtime snack with protein and carbohydrate if your blood sugar is lower than 100mg /dl .  To treat a low blood sugar - eat 4 glucose tablets then 10-15 minutes later eat ONLY 1 package of peanut butter crackers and go back to bed   [facility] Long Term Goal (31-90 days)  -improve A1C to less than 7.2% in 3 months       [facility] Long Term Goal Start Date  05/10/14     Follow up in one month.  Susette RacerJulie Mandeep Kiser, RN, BA, MHA, CDE [facility] Diabetes Coordinator- Link To [employer] Direct Dial:  [phone]  Fax:  [phone] E-mail: [email or web address] [address], [place], [place]  [id number]

## 2014-06-08 ENCOUNTER — Other Ambulatory Visit: Payer: Self-pay

## 2014-06-08 NOTE — Patient Outreach (Signed)
[facility]) Care Management  06/08/2014  Jon Hall 1970-11-16 [id number]   Left a message requesting the patient call me to schedule a visit; either 5/9 @ 2:30pm or 5/10 @ 8:30am   Susette RacerJulie Jacee Enerson, RN, [place], [place]HA, CDE [facility] Diabetes Coordinator- Link To [employer] Direct Dial:  [phone]  Fax:  [phone] E-mail: [email or web address] [address], [place], [place]  [id number]

## 2014-06-19 ENCOUNTER — Other Ambulatory Visit: Payer: Self-pay

## 2014-06-19 NOTE — Patient Outreach (Signed)
[facility]N) Care Management  06/19/2014  Jon Hall [birth year] [id number]  Spoke to Peyton NajjarLarry regarding next visit.  His A1C was 8.1% in February because he ran out of [place]xiga and insulin, ordered it on a Thursday, then forgot to pick it up- he went at least 3 days without these meds.  He now tells me he's back on track, with blood sugars 130-140mg /dl in the mornings.  He's not checking blood sugars after meals or in the evenings so I've asked him to check more than just the fasting blood sugars so Dr. Tedd SiasSolum can adjust medications appropriately if needed.   Lab draw- July 07, 2014 Dr. Graciela HusbandsKlein visit- July 13, 2014 Dr. Tedd SiasSolum visit- July 17, 2014   Susette RacerJulie Bobi Daudelin, RN, BA, MHA, CDE [facility] Diabetes Coordinator- Link To Wellness Direct Dial:  [phone]  Fax:  [phone] E-mail: [email or web address] [address], [place], [place]  [id number]

## 2014-08-23 ENCOUNTER — Other Ambulatory Visit: Payer: Self-pay

## 2014-08-23 ENCOUNTER — Other Ambulatory Visit: Payer: Self-pay | Admitting: Internal Medicine

## 2014-08-23 VITALS — BP 120/80 | Ht 72.0 in | Wt 266.2 lb

## 2014-08-23 DIAGNOSIS — E119 Type 2 diabetes mellitus without complications: Secondary | ICD-10-CM

## 2014-08-23 DIAGNOSIS — K922 Gastrointestinal hemorrhage, unspecified: Secondary | ICD-10-CM

## 2014-08-23 DIAGNOSIS — R1013 Epigastric pain: Secondary | ICD-10-CM

## 2014-08-23 NOTE — Patient Outreach (Signed)
[facility]) Care Management  [facility] Care Manager  08/23/2014   Jon Hall [birth year] [id number]  Subjective: Patient in for his regualr diabetes visit- fasting blood sugars 76-134mg /dl; no pre-supper, post meal of hs blood sugar readings- last blood sugar taken on 08/14/14.  Reports A1C of 7.3%. He asked about using smoothies vs. Eating at his breakfast- encouraged a meal for satiety and for better blood sugar control.   Objective:  Filed Vitals:   08/23/14 1019  BP: 120/80   Weight 266.2lbs  Current Medications:  Current Outpatient Prescriptions  Medication Sig Dispense Refill  . amLODipine (NORVASC) 5 MG tablet Take by mouth.    . dapagliflozin propanediol (FARXIGA) 5 MG TABS tablet Take by mouth.    . fenofibrate 160 MG tablet Take by mouth.    Marland Kitchen glimepiride (AMARYL) 4 MG tablet Take by mouth.    Marland Kitchen glucose blood (BAYER CONTOUR NEXT TEST) test strip Use once daily. Use to check blood sugar twice a day    . insulin aspart protamine - aspart (NOVOLOG 70/30 MIX) (70-30) 100 UNIT/ML FlexPen Inject 38-60 Units into the skin. 38 units qam, 60 units qpm    . lisinopril (PRINIVIL,ZESTRIL) 40 MG tablet Take by mouth.    . metFORMIN (GLUCOPHAGE) 1000 MG tablet Take by mouth.    . oxyCODONE-acetaminophen (PERCOCET/ROXICET) 5-325 MG per tablet Take by mouth.    . rosuvastatin (CRESTOR) 20 MG tablet Take by mouth.    Marland Kitchen amLODipine (NORVASC) 5 MG tablet Take 5 mg by mouth daily.    . dapagliflozin propanediol (FARXIGA) 5 MG TABS tablet Take 5 mg by mouth daily.    . diazepam (VALIUM) 5 MG tablet Take 1 tablet (5 mg total) by mouth every 6 (six) hours as needed (Muscle spasms). (Patient not taking: Reported on 05/10/2014) 30 tablet 0  . fenofibrate 160 MG tablet Take 160 mg by mouth daily.    Marland Kitchen glimepiride (AMARYL) 4 MG tablet Take 4 mg by mouth daily before breakfast.    . hydrOXYzine (ATARAX/VISTARIL) 50 MG tablet Take 1 tablet (50 mg total) by mouth every 4 (four) hours as  needed (Nausea). (Patient not taking: Reported on 05/10/2014) 30 tablet 0  . insulin aspart protamine-insulin aspart (NOVOLOG 70/30) (70-30) 100 UNIT/ML injection Inject 36-60 Units into the skin 2 (two) times daily with a meal. 36 units with breakfast, 60 units with supper    . lisinopril (PRINIVIL,ZESTRIL) 40 MG tablet Take 40 mg by mouth daily.    . metFORMIN (GLUMETZA) 1000 MG (MOD) 24 hr tablet Take 1,000 mg by mouth 2 (two) times daily with a meal.    . omega-3 acid ethyl esters (LOVAZA) 1 G capsule Take 1,000 mg by mouth 2 (two) times daily. Patient has only been taking 3 tablets per day    . omega-3 acid ethyl esters (LOVAZA) 1 G capsule Take 3 tablets in the morning and 2 tablet in the evening    . Omega-3 Fatty Acids (FISH OIL) 1000 MG CAPS Take 3,000 mg by mouth daily.    Marland Kitchen oxyCODONE-acetaminophen (PERCOCET/ROXICET) 5-325 MG per tablet Take 1 tablet by mouth every 4 (four) hours as needed for pain.    Marland Kitchen oxyCODONE-acetaminophen (PERCOCET/ROXICET) 5-325 MG per tablet Take 1-2 tablets by mouth every 4 (four) hours as needed for pain. 60 tablet 0  . rosuvastatin (CRESTOR) 20 MG tablet Take 20 mg by mouth daily.     No current facility-administered medications for this visit.    Functional Status:  In your present state of health, do you have any difficulty performing the following activities: 08/23/2014 05/10/2014  Hearing? N N  Vision? N N  Difficulty concentrating or making decisions? N -  Walking or climbing stairs? Y Y  Dressing or bathing? Y Y  Doing errands, shopping? N N    Fall/Depression Screening: PHQ 2/9 Scores 05/10/2014  PHQ - 2 Score 0    Assessment: Remarkably better blood sugar control than when I first began seeing Jon Hall; he is consistently taking his medications and taking it immediately before eating- A1C from 8.1% to 7.3% over the past few months.  No hypoglycemia.   Plan: [facility] over the next few months.  He wants to lose some  weight and is anxious to speak to Dr. Dutch Hall about beginning an exercise program; cardio and weights.  Follow up with me in October 2016.    [facility] Problem One        Patient Outreach from 08/23/2014 in [facility]   Care Plan Problem One  Elevated A1C   Care Plan for Problem One  Active   THN Long Term Goal (31-90 days)  -improve A1C to less than 7.0% in 3 months      [facility]N Long Term Goal Start Date  05/10/14   Interventions for Problem One Long Term Goal  Ask Dr. Dutch Hall about starting an exercise program- consider supervised exercise at [facility]      Susette RacerJulie Jon Battisti, RN, [place], [place], CDE [facility] Diabetes Coordinator- Link To Wellness Direct Dial:  [phone]  Fax:  [phone] E-mail: [email or web address] [address], [place], [place]  [id number]

## 2014-08-30 ENCOUNTER — Ambulatory Visit: Payer: Medicare Other

## 2014-08-30 ENCOUNTER — Ambulatory Visit
Admission: RE | Admit: 2014-08-30 | Discharge: 2014-08-30 | Disposition: A | Discharge status: Home / Self Care | Payer: 59 | Source: Ambulatory Visit | Attending: Internal Medicine | Admitting: Internal Medicine

## 2014-08-30 DIAGNOSIS — I700 Atherosclerosis of aorta: Secondary | ICD-10-CM | POA: Diagnosis not present

## 2014-08-30 DIAGNOSIS — K760 Fatty (change of) liver, not elsewhere classified: Secondary | ICD-10-CM | POA: Insufficient documentation

## 2014-08-30 DIAGNOSIS — K922 Gastrointestinal hemorrhage, unspecified: Secondary | ICD-10-CM | POA: Diagnosis present

## 2014-08-30 DIAGNOSIS — R1013 Epigastric pain: Secondary | ICD-10-CM | POA: Diagnosis present

## 2014-08-30 MED ORDER — IOHEXOL 350 MG/ML SOLN
100.0000 mL | Freq: Once | INTRAVENOUS | Status: AC | PRN
  Administered 2014-08-30: 100 mL via INTRAVENOUS

## 2014-11-29 ENCOUNTER — Ambulatory Visit: Payer: 59

## 2014-12-01 ENCOUNTER — Other Ambulatory Visit: Payer: Self-pay

## 2014-12-01 VITALS — BP 118/78 | Ht 72.0 in | Wt 263.5 lb

## 2014-12-01 DIAGNOSIS — E119 Type 2 diabetes mellitus without complications: Secondary | ICD-10-CM

## 2014-12-01 DIAGNOSIS — Z794 Long term (current) use of insulin: Principal | ICD-10-CM

## 2014-12-01 LAB — POCT GLYCOSYLATED HEMOGLOBIN (HGB A1C): Hemoglobin A1C: 7.5

## 2014-12-01 NOTE — Addendum Note (Signed)
Addended by: Suszanne ConnersMONTPELLIER, Sumaiyah Markert M on: 12/01/2014 03:05 PM   Modules accepted: Orders

## 2014-12-01 NOTE — Patient Outreach (Signed)
[facility]) Care Management  [facility] Care Manager  12/01/2014   Jon Hall 12-Mar-1970 [id number]  Subjective: Jon Hall was in for his Link to Wellness diabetes visit.  He is attentive to his diabetes, checks his blood sugars regularly, paying attention to eating and including carbs and proteins at all meals and snacks, and sees MDs as scheduled. He saw Dr. Graciela Hall and Dr. Tedd Hall in October- A1C from chart 7.8%- MD increrased Jon Hall to /day. Ongoing pain in his lower back, left buttock and numbness in his thighs and all the toes on his left foot.   Objective:  Filed Vitals:   12/01/14 1037  BP: 118/78  Height: 1.829 m (6')  Weight: 263 lb 8 oz (119.523 kg)  (his weight is down 3lbs since our last visit and down from 270.5lbs from 2013) POCT A1C 7.5% (improved from 9.3% in August 2013  Current Medications:  Current Outpatient Prescriptions  Medication Sig Dispense Refill  . amLODipine (NORVASC) 5 MG tablet Take 5 mg by mouth daily.    . dapagliflozin propanediol (FARXIGA) 5 MG TABS tablet Take 10 mg by mouth.     . fenofibrate 160 MG tablet Take 160 mg by mouth daily.    Marland Kitchen glimepiride (AMARYL) 4 MG tablet Take 4 mg by mouth daily before breakfast.    . glucose blood (BAYER CONTOUR NEXT TEST) test strip Use once daily. Use to check blood sugar twice a day    . insulin aspart protamine - aspart (NOVOLOG 70/30 MIX) (70-30) 100 UNIT/ML FlexPen Inject 38-60 Units into the skin. 38 units qam, 60 units qpm    . lisinopril (PRINIVIL,ZESTRIL) 40 MG tablet Take 40 mg by mouth daily.    . metFORMIN (GLUMETZA) 1000 MG (MOD) 24 hr tablet Take 1,000 mg by mouth 2 (two) times daily with a meal.    . omega-3 acid ethyl esters (LOVAZA) 1 G capsule Take 3 tablets in the morning and 2 tablet in the evening    . oxyCODONE-acetaminophen (PERCOCET/ROXICET) 5-325 MG per tablet Take 1 tablet by mouth every 4 (four) hours as needed for pain.    . rosuvastatin (CRESTOR) 20 MG tablet Take 20  mg by mouth daily.    Marland Kitchen amLODipine (NORVASC) 5 MG tablet Take by mouth.    . dapagliflozin propanediol (FARXIGA) 5 MG TABS tablet Take 5 mg by mouth daily.    . diazepam (VALIUM) 5 MG tablet Take 1 tablet (5 mg total) by mouth every 6 (six) hours as needed (Muscle spasms). (Patient not taking: Reported on 05/10/2014) 30 tablet 0  . fenofibrate 160 MG tablet Take by mouth.    Marland Kitchen glimepiride (AMARYL) 4 MG tablet Take by mouth.    . hydrOXYzine (ATARAX/VISTARIL) 50 MG tablet Take 1 tablet (50 mg total) by mouth every 4 (four) hours as needed (Nausea). (Patient not taking: Reported on 05/10/2014) 30 tablet 0  . insulin aspart protamine-insulin aspart (NOVOLOG 70/30) (70-30) 100 UNIT/ML injection Inject 36-60 Units into the skin 2 (two) times daily with a meal. 36 units with breakfast, 60 units with supper    . lisinopril (PRINIVIL,ZESTRIL) 40 MG tablet Take by mouth.    . metFORMIN (GLUCOPHAGE) 1000 MG tablet Take by mouth.    . omega-3 acid ethyl esters (LOVAZA) 1 G capsule Take 1,000 mg by mouth 2 (two) times daily. Patient has only been taking 3 tablets per day    . Omega-3 Fatty Acids (FISH OIL) 1000 MG CAPS Take 3,000 mg by mouth daily.    Marland Kitchen  oxyCODONE-acetaminophen (PERCOCET/ROXICET) 5-325 MG per tablet Take 1-2 tablets by mouth every 4 (four) hours as needed for pain. (Patient not taking: Reported on 12/01/2014) 60 tablet 0  . oxyCODONE-acetaminophen (PERCOCET/ROXICET) 5-325 MG per tablet Take by mouth.    . rosuvastatin (CRESTOR) 20 MG tablet Take by mouth.     No current facility-administered medications for this visit.    Functional Status:  In your present state of health, do you have any difficulty performing the following activities: 12/01/2014 08/23/2014  Hearing? N N  Vision? N N  Difficulty concentrating or making decisions? N N  Walking or climbing stairs? Y Y  Dressing or bathing? Y Y  Doing errands, shopping? N N    Fall/Depression Screening: PHQ 2/9 Scores 12/01/2014 05/10/2014   PHQ - 2 Score 0 0    Assessment: Working to improve his blood sugars.  He is attentive at our visits, asks questions and making life changes based on our conversations.   Plan: We've discussed his back pain which is affecting his perception of his health- he will meet with pain management in December and Dr. Dutch Hall in January.  We discussed the need for muscle vs. Fat in his body and although he does the exercises Dr. Dutch Hall has recommended I have suggested light hand weights (stop if any pain) and walking as tolerated(cardiovascular health).  He has thought about buying a treadmill- I have asked him to try the [facility] gym equipment to see what equipment feels the best- the gym may be a better option because he could vary the equipment he uses and be supported by the staff- he will discuss with Dr. Dutch Hall before moving forward. We discussed Pure Barre exercises as a possible option for muscle strengthening- this form of exercise creates little pressure on the joints but would help develop muscle to support his back.  [facility] CM Care Plan Problem One        Most Recent Value   Care Plan Problem One  Elevated A1C   Role Documenting the Problem One  Care Management Coordinator   Care Plan for Problem One  Active   [facility]N Long Term Goal (31-90 days)  -improve A1C to less than 7.0% in 3 months      [facility]N Long Term Goal Start Date  12/01/14   Interventions for Problem One Long Term Goal  Ask Dr. Dutch Hall about starting an exercise program- consider supervised exercise at [facility]       I will follow up with him next month.     Jon RacerJulie Kaelynne Christley, RN, BA, MHA, CDE [facility] Diabetes Coordinator- Link To [employer]ness Direct Dial:  [phone]  Fax:  [phone] E-mail: [email or web address] [address], [place], [place]  [id number]

## 2014-12-27 ENCOUNTER — Other Ambulatory Visit: Payer: Self-pay

## 2014-12-27 NOTE — Patient Outreach (Signed)
[facility]) Care Management  12/27/2014  Conley CanalLarry D Bohanon Jan 27, 1971 [id number]   I spoke to Peyton NajjarLarry by phone today.  He continues to take his diabetes medications as ordered.  He recently increased his Farxiga from 5 to 10mg /day and reports fasting blood sugars from 108-140mg /dl.  He denies hypoglycemia or burning with urination.   He has scheduled visits with pain management, Dr. Dutch QuintPoole, Dr. Tedd SiasSolum and Dr. Graciela HusbandsKlein in the next few months.    He has switched from drinking sodas to water and unsweetened tea in an attempt to decrease his weight to a goal of 240-250lbs.  He has limited ability to exercise because of ongoing back pain.   Londell's goals are        1. Maintain tea and water as beverages of choice    2. Exercise as he is able    3. Take meds as ordered    4. Continue to check blood sugars most days of the week   Susette RacerJulie Kashmere Daywalt, RN, [place], [place]HA, CDE [facility] Diabetes Coordinator- Link To [employer] Direct Dial:  [phone]  Fax:  [phone] E-mail: [email or web address] [address], [place], [place]  [id number]

## 2015-02-22 DIAGNOSIS — M961 Postlaminectomy syndrome, not elsewhere classified: Secondary | ICD-10-CM | POA: Diagnosis not present

## 2015-02-22 DIAGNOSIS — Z6834 Body mass index (BMI) 34.0-34.9, adult: Secondary | ICD-10-CM | POA: Diagnosis not present

## 2015-03-04 DIAGNOSIS — J0100 Acute maxillary sinusitis, unspecified: Secondary | ICD-10-CM | POA: Diagnosis not present

## 2015-03-12 DIAGNOSIS — M4806 Spinal stenosis, lumbar region: Secondary | ICD-10-CM | POA: Diagnosis not present

## 2015-03-12 DIAGNOSIS — M961 Postlaminectomy syndrome, not elsewhere classified: Secondary | ICD-10-CM | POA: Diagnosis not present

## 2015-03-12 DIAGNOSIS — Z6833 Body mass index (BMI) 33.0-33.9, adult: Secondary | ICD-10-CM | POA: Diagnosis not present

## 2015-04-09 DIAGNOSIS — Z125 Encounter for screening for malignant neoplasm of prostate: Secondary | ICD-10-CM | POA: Diagnosis not present

## 2015-04-09 DIAGNOSIS — E1129 Type 2 diabetes mellitus with other diabetic kidney complication: Secondary | ICD-10-CM | POA: Diagnosis not present

## 2015-04-09 DIAGNOSIS — E538 Deficiency of other specified B group vitamins: Secondary | ICD-10-CM | POA: Diagnosis not present

## 2015-04-09 DIAGNOSIS — R809 Proteinuria, unspecified: Secondary | ICD-10-CM | POA: Diagnosis not present

## 2015-04-16 DIAGNOSIS — E1129 Type 2 diabetes mellitus with other diabetic kidney complication: Secondary | ICD-10-CM | POA: Diagnosis not present

## 2015-04-16 DIAGNOSIS — E1165 Type 2 diabetes mellitus with hyperglycemia: Secondary | ICD-10-CM | POA: Diagnosis not present

## 2015-04-16 DIAGNOSIS — E538 Deficiency of other specified B group vitamins: Secondary | ICD-10-CM | POA: Diagnosis not present

## 2015-04-16 DIAGNOSIS — R809 Proteinuria, unspecified: Secondary | ICD-10-CM | POA: Diagnosis not present

## 2015-04-16 DIAGNOSIS — I10 Essential (primary) hypertension: Secondary | ICD-10-CM | POA: Diagnosis not present

## 2015-04-16 DIAGNOSIS — Z794 Long term (current) use of insulin: Secondary | ICD-10-CM | POA: Diagnosis not present

## 2015-04-16 DIAGNOSIS — M4806 Spinal stenosis, lumbar region: Secondary | ICD-10-CM | POA: Diagnosis not present

## 2015-06-04 DIAGNOSIS — Z6834 Body mass index (BMI) 34.0-34.9, adult: Secondary | ICD-10-CM | POA: Diagnosis not present

## 2015-06-04 DIAGNOSIS — M961 Postlaminectomy syndrome, not elsewhere classified: Secondary | ICD-10-CM | POA: Diagnosis not present

## 2015-06-04 DIAGNOSIS — M4806 Spinal stenosis, lumbar region: Secondary | ICD-10-CM | POA: Diagnosis not present

## 2015-06-11 ENCOUNTER — Other Ambulatory Visit: Payer: Self-pay

## 2015-06-11 NOTE — Patient Outreach (Signed)
[facility]) Care Management  06/11/2015  Jon Hall Jun 10, 1970 [id number]  Left message for the patient to call me to schedule an appointment.   Jon RacerJulie Amara Justen, RN, BA, MHA, CDE [facility] Diabetes Coordinator- Link To [employer]ness Direct Dial:  [phone]  Fax:  [phone] E-mail: [name] [address], [place], [place]  [id number]

## 2015-06-13 ENCOUNTER — Other Ambulatory Visit: Payer: Self-pay

## 2015-06-13 NOTE — Patient Outreach (Signed)
[facility]) Care Management  06/13/2015  Conley CanalLarry D Ransford 10-28-1970 [id number]   Peyton NajjarLarry returned my call- left a voice message. I have now called his mobile number and his home number and scheduled Peyton NajjarLarry to see me on 07/10/15 at 11:30am.   Susette RacerJulie Hayzen Lorenson, RN, BA, MHA, CDE [facility] Diabetes Coordinator- Link To Wellness Direct Dial:  [phone]  Fax:  [phone] E-mail: [email or web address] [address], [place], [place]  [id number]

## 2015-06-22 DIAGNOSIS — M961 Postlaminectomy syndrome, not elsewhere classified: Secondary | ICD-10-CM | POA: Diagnosis not present

## 2015-06-22 DIAGNOSIS — Z6833 Body mass index (BMI) 33.0-33.9, adult: Secondary | ICD-10-CM | POA: Diagnosis not present

## 2015-07-10 ENCOUNTER — Other Ambulatory Visit: Payer: Self-pay

## 2015-07-10 VITALS — BP 120/70 | HR 81 | Ht 72.0 in | Wt 264.7 lb

## 2015-07-10 DIAGNOSIS — E1165 Type 2 diabetes mellitus with hyperglycemia: Principal | ICD-10-CM

## 2015-07-10 DIAGNOSIS — Z794 Long term (current) use of insulin: Principal | ICD-10-CM

## 2015-07-10 DIAGNOSIS — IMO0001 Reserved for inherently not codable concepts without codable children: Secondary | ICD-10-CM

## 2015-07-10 NOTE — Patient Outreach (Signed)
[facility]) Care Management  [facility]  07/10/2015   Jon Hall 1970-10-18 [id number]  Subjective: Patient in for his routine Link to Wellness visit.  He has not been to see me in quite some time and has slipped away from some of the healthy habits he has previously used to keep blood sugars under control.  He is not always eating carbohydrates at meals and sometimes eats too many, he is eating too late at night, he is not checking sugars 2x/day, he is forgetting insulin a couple times a week. He denies SOB or chest pain.  Objective:  Filed Vitals:   07/10/15 1150  BP: 120/70  Pulse: 81  Height: 1.829 m (6')  Weight: 264 lb 11.2 oz (120.067 kg)  SpO2: 97%   Weight stable but would like to go down to 240-250lbs.  Encounter Medications:  Outpatient Encounter Prescriptions as of 07/10/2015  Medication Sig Note  . amLODipine (NORVASC) 5 MG tablet Take 5 mg by mouth daily.   . dapagliflozin propanediol (FARXIGA) 5 MG TABS tablet Take 5 mg by mouth daily.   . fenofibrate 160 MG tablet Take 160 mg by mouth daily.   Marland Kitchen. glimepiride (AMARYL) 4 MG tablet Take 4 mg by mouth daily before breakfast.   . glucose blood (BAYER CONTOUR NEXT TEST) test strip Use once daily. Use to check blood sugar twice a day 08/23/2014: [facility]  . insulin aspart protamine - aspart (NOVOLOG 70/30 MIX) (70-30) 100 UNIT/ML FlexPen Inject 50-60 Units into the skin. 38 units qam, 60 units qpm 08/23/2014: [facility]  . lisinopril (PRINIVIL,ZESTRIL) 40 MG tablet Take 40 mg by mouth daily.   . metFORMIN (GLUMETZA) 1000 MG (MOD) 24 hr tablet Take 1,000 mg by mouth 2 (two) times daily with a meal.   . omega-3 acid ethyl esters (LOVAZA) 1 G capsule Take 3 tablets in the morning and 3 tablet in the evening 08/23/2014: [facility]  . oxyCODONE-acetaminophen (PERCOCET/ROXICET) 5-325 MG per tablet Take 1  tablet by mouth every 4 (four) hours as needed for pain.   . rosuvastatin (CRESTOR) 20 MG tablet Take 20 mg by mouth daily.   Marland Kitchen. amLODipine (NORVASC) 5 MG tablet Take by mouth. Reported on 07/10/2015 08/23/2014: [facility]  . dapagliflozin propanediol (FARXIGA) 5 MG TABS tablet Take 10 mg by mouth. Reported on 07/10/2015 08/23/2014: [facility]  . diazepam (VALIUM) 5 MG tablet Take 1 tablet (5 mg total) by mouth every 6 (six) hours as needed (Muscle spasms). (Patient not taking: Reported on 05/10/2014)   . fenofibrate 160 MG tablet Take by mouth. Reported on 07/10/2015 08/23/2014: [facility]  . glimepiride (AMARYL) 4 MG tablet Take by mouth. Reported on 07/10/2015 08/23/2014: [facility]  . hydrOXYzine (ATARAX/VISTARIL) 50 MG tablet Take 1 tablet (50 mg total) by mouth every 4 (four) hours as needed (Nausea). (Patient not taking: Reported on 05/10/2014)   . insulin aspart protamine-insulin aspart (NOVOLOG 70/30) (70-30) 100 UNIT/ML injection Inject 36-60 Units into the skin 2 (two) times daily with a meal. Reported on 07/10/2015   . lisinopril (PRINIVIL,ZESTRIL) 40 MG tablet Take by mouth. Reported on 07/10/2015 08/23/2014: [facility]  . metFORMIN (GLUCOPHAGE) 1000 MG tablet Take by mouth. Reported on 07/10/2015 08/23/2014: [facility]  . omega-3 acid ethyl  esters (LOVAZA) 1 G capsule Take 1,000 mg by mouth 2 (two) times daily. Reported on 07/10/2015   . Omega-3 Fatty Acids (FISH OIL) 1000 MG CAPS Take 3,000 mg by mouth daily. Reported on 07/10/2015 05/10/2014: Taking /day  . oxyCODONE-acetaminophen (PERCOCET/ROXICET) 5-325 MG per tablet Take 1-2 tablets by mouth every 4 (four) hours as needed for pain. (Patient not taking: Reported on 12/01/2014)   . oxyCODONE-acetaminophen (PERCOCET/ROXICET) 5-325 MG per tablet Take by mouth.  Reported on 07/10/2015 08/23/2014: Received from: [facility]  . rosuvastatin (CRESTOR) 20 MG tablet Take by mouth. Reported on 07/10/2015 08/23/2014: Re[facility] from: [facility]   No facility-administered encounter medications on file as of 07/10/2015.    Functional Status:  In your present state of health, do you have any difficulty performing the following activities: 07/10/2015 12/01/2014  Hearing? N N  Vision? N N  Difficulty concentrating or making decisions? N N  Walking or climbing stairs? Y Y  Dressing or bathing? Y Y  Doing errands, shopping? Y N  Preparing Food and eating ? N -  Using the Toilet? Y -  Managing your Medications? N -  Housekeeping or managing your Housekeeping? Y -    Fall/Depression Screening: PHQ 2/9 Scores 07/10/2015 12/01/2014 05/10/2014  PHQ - 2 Score 0 0 0    Assessment: We reviewed the importance of healthy habits he has previously used to keep his blood sugars under control.  He is willing to change the habits he has slipped into to.  He has gone from 38 units of 70/30 units in the morning up to 50 units 70/30 units in the morning.  He continues to take 60 units of 70/30 insulin qpm. He is taking his oral diabetes medications as ordered.  We reviewed the symptoms of a heart attack and stroke.   Plan:  [facility] CM Care Plan Problem One        Most Recent Value   Care Plan Problem One  Elevated A1C   Role Documenting the Problem One  Care Management Coordinator   Care Plan for Problem One  Active   [facility] Long Term Goal (31-90 days)  -improve A1C to less than 7.0% in 3 months      [facility] Long Term Goal Start Date  07/10/15   Interventions for Problem One Long Term Goal  check sugars 2x/day- fasting and when you take your insulin     I have asked him to take his insulin and check his blood sugars twice a day; fasting and just before supper.  I have asked him to follow up with me in 1 month.  He needs a dilated eye exam.   We reviewed meal  planning using the paperwork called Planning a Balance meal- I have reviewed the importance of protein and a consistent amount of carbs at each meal.  Follow up 08/15/15 @ noon.   Susette Racer, RN, BA, MHA, CDE [facility] Diabetes Coordinator- Link To [employer] Dial:  [phone]  Fax:  [phone] E-mail: [name] [address] [place], [place]  [address]

## 2015-07-19 DIAGNOSIS — E1165 Type 2 diabetes mellitus with hyperglycemia: Secondary | ICD-10-CM | POA: Diagnosis not present

## 2015-07-19 DIAGNOSIS — Z794 Long term (current) use of insulin: Secondary | ICD-10-CM | POA: Diagnosis not present

## 2015-07-24 DIAGNOSIS — E1165 Type 2 diabetes mellitus with hyperglycemia: Secondary | ICD-10-CM | POA: Diagnosis not present

## 2015-07-24 DIAGNOSIS — E782 Mixed hyperlipidemia: Secondary | ICD-10-CM | POA: Diagnosis not present

## 2015-07-24 DIAGNOSIS — Z794 Long term (current) use of insulin: Secondary | ICD-10-CM | POA: Diagnosis not present

## 2015-08-15 ENCOUNTER — Other Ambulatory Visit: Payer: Self-pay

## 2015-08-15 ENCOUNTER — Ambulatory Visit: Payer: Self-pay

## 2015-08-15 NOTE — Patient Outreach (Signed)
[facility]) Care Management  08/15/2015  Jon CanalLarry D Hall [birth year] [id number]  Patient did not show for scheduled visit today- I called to reschedule but no answer and no answering machine to leave a message.     Susette RacerJulie Keeghan Mcintire, RN, BA, MHA, CDE [facility] Diabetes Coordinator- Link To [employer] Direct Dial:  [phone]  Fax:  [phone] E-mail: [email or web address] [address], [place], [place]  [id number]

## 2015-09-10 DIAGNOSIS — M961 Postlaminectomy syndrome, not elsewhere classified: Secondary | ICD-10-CM | POA: Diagnosis not present

## 2015-09-10 DIAGNOSIS — M4806 Spinal stenosis, lumbar region: Secondary | ICD-10-CM | POA: Diagnosis not present

## 2015-10-10 DIAGNOSIS — E782 Mixed hyperlipidemia: Secondary | ICD-10-CM | POA: Diagnosis not present

## 2015-10-10 DIAGNOSIS — Z794 Long term (current) use of insulin: Secondary | ICD-10-CM | POA: Diagnosis not present

## 2015-10-10 DIAGNOSIS — R809 Proteinuria, unspecified: Secondary | ICD-10-CM | POA: Diagnosis not present

## 2015-10-10 DIAGNOSIS — E538 Deficiency of other specified B group vitamins: Secondary | ICD-10-CM | POA: Diagnosis not present

## 2015-10-10 DIAGNOSIS — E1165 Type 2 diabetes mellitus with hyperglycemia: Secondary | ICD-10-CM | POA: Diagnosis not present

## 2015-10-10 DIAGNOSIS — E1129 Type 2 diabetes mellitus with other diabetic kidney complication: Secondary | ICD-10-CM | POA: Diagnosis not present

## 2015-10-15 ENCOUNTER — Other Ambulatory Visit: Payer: Self-pay

## 2015-10-15 VITALS — BP 132/70 | HR 102 | Ht 72.0 in | Wt 268.2 lb

## 2015-10-15 DIAGNOSIS — Z794 Long term (current) use of insulin: Principal | ICD-10-CM

## 2015-10-15 DIAGNOSIS — E119 Type 2 diabetes mellitus without complications: Secondary | ICD-10-CM

## 2015-10-15 NOTE — Patient Outreach (Signed)
[facility]) Care Management  [place]  10/15/2015   Jon Hall November 16, 1970 [id number]  Subjective: Met with patient for his Link to Wellness visit. He brought his meter with him but he has only checked 7 times in the last 14 days. They have all been fasting numbers.  He has increased his Novolog 70/30 insulin to 60 units qam and 70 units qpm- he says he's been consistently taking it. He is not eating well.  He's eating cereal and milk for breakfast, skipping lunch then overeating at supper.  He complains of gaining weight.   Objective:  Vitals:   10/15/15 1311  BP: 132/70  Pulse: (!) 102  SpO2: 97%  Weight: 268 lb 3.2 oz (121.7 kg)  Height: 1.829 m (6')   Weight up 4 lbs since last visit.   Encounter Medications:  Outpatient Encounter Prescriptions as of 10/15/2015  Medication Sig Note  . amLODipine (NORVASC) 5 MG tablet Take 5 mg by mouth daily.   . dapagliflozin propanediol (FARXIGA) 5 MG TABS tablet Take 5 mg by mouth daily.   . fenofibrate 160 MG tablet Take 160 mg by mouth daily.   . insulin aspart protamine - aspart (NOVOLOG 70/30 MIX) (70-30) 100 UNIT/ML FlexPen Inject 60 Units into the skin daily with breakfast.  08/23/2014: [facility] from: [facility]  . insulin aspart protamine-insulin aspart (NOVOLOG 70/30) (70-30) 100 UNIT/ML injection Inject 70 Units into the skin daily with supper. Reported on 10/15/15   . lisinopril (PRINIVIL,ZESTRIL) 40 MG tablet Take 40 mg by mouth daily.   . metFORMIN (GLUMETZA) 1000 MG (MOD) 24 hr tablet Take 1,000 mg by mouth 2 (two) times daily with a meal.   . omega-3 acid ethyl esters (LOVAZA) 1 G capsule Take 1,000 mg by mouth 2 (two) times daily. Reported on 07/10/2015   . oxyCODONE-acetaminophen (PERCOCET/ROXICET) 5-325 MG per tablet Take 1 tablet by mouth every 4 (four) hours as needed for pain.   . rosuvastatin (CRESTOR) 20 MG tablet Take 20 mg by mouth daily.   Marland Kitchen amLODipine (NORVASC) 5 MG  tablet Take by mouth. Reported on 07/10/2015 08/23/2014: Received from: [facility]  . dapagliflozin propanediol (FARXIGA) 5 MG TABS tablet Take 10 mg by mouth. Reported on 07/10/2015 08/23/2014: Received from: [place]  . diazepam (VALIUM) 5 MG tablet Take 1 tablet (5 mg total) by mouth every 6 (six) hours as needed (Muscle spasms). (Patient not taking: Reported on 05/10/2014)   . fenofibrate 160 MG tablet Take by mouth. Reported on 07/10/2015 08/23/2014: Received from: [place]  . glimepiride (AMARYL) 4 MG tablet Take 4 mg by mouth daily before breakfast.   . glimepiride (AMARYL) 4 MG tablet Take by mouth. Reported on 07/10/2015 08/23/2014: Received from: [place]  . glucose blood (BAYER CONTOUR NEXT TEST) test strip Use once daily. Use to check blood sugar twice a day 08/23/2014: Received from: [facility]  . hydrOXYzine (ATARAX/VISTARIL) 50 MG tablet Take 1 tablet (50 mg total) by mouth every 4 (four) hours as needed (Nausea). (Patient not taking: Reported on 05/10/2014)   . lisinopril (PRINIVIL,ZESTRIL) 40 MG tablet Take by mouth. Reported on 07/10/2015 08/23/2014: Received from: [place]  . metFORMIN (GLUCOPHAGE) 1000 MG tablet Take by mouth. Reported on 07/10/2015 08/23/2014: Received from: [place]  . omega-3 acid ethyl esters (LOVAZA) 1 G capsule Take 3 tablets in the morning and 3 tablet in  the evening 08/23/2014: Received from: [facility]  . Omega-3 Fatty Acids (FISH OIL) 1000 MG CAPS Take 3,000 mg by mouth daily. Reported on 07/10/2015 05/10/2014: Taking 3035m/day  . oxyCODONE-acetaminophen (PERCOCET/ROXICET) 5-325 MG per tablet Take 1-2 tablets by mouth every 4 (four) hours as needed for pain. (Patient not taking: Reported on 12/01/2014)   . oxyCODONE-acetaminophen (PERCOCET/ROXICET) 5-325 MG per tablet Take by mouth. Reported on 07/10/2015 08/23/2014: Received  from: [place] . rosuvastatin (CRESTOR) 20 MG tablet Take by mouth. Reported on 07/10/2015 08/23/2014: Received from: [place]  No facility-administered encounter medications on file as of 10/15/2015.     Functional Status:  In your present state of health, do you have any difficulty performing the following activities: 07/10/2015 12/01/2014  Hearing? N N  Vision? N N  Difficulty concentrating or making decisions? N N  Walking or climbing stairs? Y Y  Dressing or bathing? Y Y  Doing errands, shopping? Y N  Preparing Food and eating ? N -  Using the Toilet? Y -  Managing your Medications? N -  Housekeeping or managing your Housekeeping? Y -  Some recent data might be hidden    Fall/Depression Screening: PHQ 2/9 Scores 10/15/2015 07/10/2015 12/01/2014 05/10/2014  PHQ - 2 Score 0 0 0 0    Assessment: Poorly engaged in his diabetes care. When he had been following meal plans and understood carbs and proteins, he was fairly well controlled. We discussed carbs (limit 3-4 servings per meal), protein and the progression of diabetes. He currently has no pain so I've asked him to speak to pain management about doing very light weights.  He has not had his dilated eye or dental visit completed.  Plan:  [facility] Problem [facility]   Flowsheet Row Most Recent Value  Care Plan Problem One  Elevated A1C  Role Documenting the Problem One  Care Management Coordinator  Care Plan for Problem One  Active  THN Long Term Goal (31-90 days)  -improve A1C to less than 7.0% in 3 months     THN Long Term Goal Start Date  10/15/15  Interventions for Problem One Long Term Goal  Reviewed carbs and protein, fats and meal spacing.  Strongly encouraged to stop skipping meals.  Reviewed medication taken.  To discuss with pain management the use of weight training as appropriate.     Check sugar 2x/day- write the numbers down and bring them to MD visit. Get dental exam and  dilated eye exam   Documents given - [employer] snacking               -  Basic carbohydrates  Follow up in one month.   JGentry Fitz RN, BA, MEagle [place] CBackusDirect Dial:  [phone] Fax:  [phone]-mail: [email or web address] [address] [place]  [address]

## 2015-10-16 DIAGNOSIS — M5136 Other intervertebral disc degeneration, lumbar region: Secondary | ICD-10-CM | POA: Diagnosis not present

## 2015-10-16 DIAGNOSIS — E782 Mixed hyperlipidemia: Secondary | ICD-10-CM | POA: Diagnosis not present

## 2015-10-16 DIAGNOSIS — I10 Essential (primary) hypertension: Secondary | ICD-10-CM | POA: Diagnosis not present

## 2015-10-16 DIAGNOSIS — Z23 Encounter for immunization: Secondary | ICD-10-CM | POA: Diagnosis not present

## 2015-10-16 DIAGNOSIS — R809 Proteinuria, unspecified: Secondary | ICD-10-CM | POA: Diagnosis not present

## 2015-10-24 DIAGNOSIS — E782 Mixed hyperlipidemia: Secondary | ICD-10-CM | POA: Diagnosis not present

## 2015-10-24 DIAGNOSIS — Z794 Long term (current) use of insulin: Secondary | ICD-10-CM | POA: Diagnosis not present

## 2015-10-24 DIAGNOSIS — E1129 Type 2 diabetes mellitus with other diabetic kidney complication: Secondary | ICD-10-CM | POA: Diagnosis not present

## 2015-10-24 DIAGNOSIS — E1165 Type 2 diabetes mellitus with hyperglycemia: Secondary | ICD-10-CM | POA: Diagnosis not present

## 2015-11-12 ENCOUNTER — Other Ambulatory Visit: Payer: Self-pay

## 2015-11-12 VITALS — BP 132/78 | Ht 72.0 in | Wt 268.2 lb

## 2015-11-12 DIAGNOSIS — E119 Type 2 diabetes mellitus without complications: Secondary | ICD-10-CM

## 2015-11-12 DIAGNOSIS — Z794 Long term (current) use of insulin: Principal | ICD-10-CM

## 2015-11-12 NOTE — Patient Outreach (Signed)
[facility]) Care Management  [place]  11/12/2015   IRA DOUGHER [birth year] [id number]  Subjective: Met with Fritz Pickerel for a follow up Link to Wellness visit.  He had great blood sugar control (fasting 98-174m/dl and post meal 91-1523mdl) until he ran out of FaIranver the weekend and now his sugars are elevated (195-31637ml).  He has picked up more Farxiga this morning.  He continues to take 70/30 insulin and Metformin. He has had no hypoglycemia, chest pain or SOB.  Objective:  Vitals:   11/12/15 1028  BP: 132/78  Weight: 268 lb 3.2 oz (121.7 kg)  Height: 1.829 m (6')     Encounter Medications:  Outpatient Encounter Prescriptions as of 11/12/2015  Medication Sig Note  . amLODipine (NORVASC) 5 MG tablet Take 5 mg by mouth daily.   . dapagliflozin propanediol (FARXIGA) 5 MG TABS tablet Take 5 mg by mouth daily.   . fenofibrate 160 MG tablet Take 160 mg by mouth daily.   . gMarland Kitchenucose blood (BAYER CONTOUR NEXT TEST) test strip Use once daily. Use to check blood sugar twice a day 08/23/2014: [facility] - aspart (NOVOLOG 70/30 MIX) (70-30) 100 UNIT/ML FlexPen Inject 60 Units into the skin daily with breakfast.  08/23/2014: [facility] protamine-insulin aspart (NOVOLOG 70/30) (70-30) 100 UNIT/ML injection Inject 70 Units into the skin daily with supper. Patient is taking 66 units.   . lMarland Kitchensinopril (PRINIVIL,ZESTRIL) 40 MG tablet Take 40 mg by mouth daily.   . metFORMIN (GLUMETZA) 1000 MG (MOD) 24 hr tablet Take 1,000 mg by mouth 2 (two) times daily with a meal.   . omega-3 acid ethyl esters (LOVAZA) 1 G capsule Take 1,000 mg by mouth 2 (two) times daily. Reported on 07/10/2015   . oxyCODONE-acetaminophen (PERCOCET/ROXICET) 5-325 MG per tablet Take 1 tablet by mouth every 4 (four) hours as needed for pain.   . rosuvastatin (CRESTOR) 20 MG tablet Take 20 mg  by mouth daily.   . aMarland KitchenLODipine (NORVASC) 5 MG tablet Take by mouth. Reported on 07/10/2015 08/23/2014: Received from: [place] dapagliflozin propanediol (FARXIGA) 5 MG TABS tablet Take 10 mg by mouth. Reported on 07/10/2015 08/23/2014: Received from: [place] diazepam (VALIUM) 5 MG tablet Take 1 tablet (5 mg total) by mouth every 6 (six) hours as needed (Muscle spasms). (Patient not taking: Reported on 11/12/2015)   . fenofibrate 160 MG tablet Take by mouth. Reported on 07/10/2015 08/23/2014: Received from: [place] glimepiride (AMARYL) 4 MG tablet Take 4 mg by mouth daily before breakfast.   . glimepiride (AMARYL) 4 MG tablet Take by mouth. Reported on 07/10/2015 08/23/2014: Received from: [place] hydrOXYzine (ATARAX/VISTARIL) 50 MG tablet Take 1 tablet (50 mg total) by mouth every 4 (four) hours as needed (Nausea). (Patient not taking: Reported on 11/12/2015)   . lisinopril (PRINIVIL,ZESTRIL) 40 MG tablet Take by mouth. Reported on 07/10/2015 08/23/2014: Received from: [place] metFORMIN (GLUCOPHAGE) 1000 MG tablet Take by mouth. Reported on 07/10/2015 08/23/2014: Received from: [place] omega-3 acid ethyl esters (LOVAZA) 1 G capsule Take 3 tablets in the morning and 3 tablet in the evening 08/23/2014: [facility]-3 Fatty Acids (FISH OIL) 1000 MG CAPS Take 3,000 mg by mouth daily. Reported on  07/10/2015 05/10/2014: Taking 3062m/day  . oxyCODONE-acetaminophen (PERCOCET/ROXICET) 5-325 MG per tablet Take 1-2 tablets by mouth every 4 (four) hours as needed for pain. (Patient not taking: Reported on 11/12/2015)   . oxyCODONE-acetaminophen (PERCOCET/ROXICET) 5-325 MG per tablet Take by mouth. Reported on 07/10/2015 08/23/2014: [place]: [place] . rosuvastatin (CRESTOR) 20 MG tablet Take by mouth. Reported on 07/10/2015  08/23/2014: [place]: [place]  No facility-administered encounter medications on file as of 11/12/2015.     Functional Status:  In your present state of health, do you have any difficulty performing the following activities: 07/10/2015 12/01/2014  Hearing? N N  Vision? N N  Difficulty concentrating or making decisions? N N  Walking or climbing stairs? Y Y  Dressing or bathing? Y Y  Doing errands, shopping? Y N  Preparing Food and eating ? N -  Using the Toilet? Y -  Managing your Medications? N -  Housekeeping or managing your Housekeeping? Y -  Some recent data might be hidden    Fall/Depression Screening: PHQ 2/9 Scores 11/12/2015 10/15/2015 07/10/2015 12/01/2014 05/10/2014  PHQ - 2 Score 0 0 0 0 0    Assessment:  LGriffynhas a significant spike in blood sugars when he eats pasta- I have recommended he try tofu pasta.  He also struggles with OHarold I have suggest he set limits on how many bread sticks he eats, try salad dressing on the side, and limit the number of times he visits there.  He has scheduled both his dilated eye exam and his dental exam.   Discussed the benefits of exercise for heart attack and stroke prevention, muscle development, blood sugar control and potential strengthening of his back- I have asked him to discuss this with Dr. PTrenton Gammon   Plan:  [facility] Problem One   Flowsheet Row Most Recent Value  Care Plan Problem One  Elevated A1C  Role Documenting the Problem One  Care Management Coordinator  Care Plan for Problem One  Active  THN Long Term Goal (31-90 days)  -improve A1C to less than 7.0% in 3 months     THN Long Term Goal Start Date  10/15/15  Interventions for Problem One Long Term Goal  Discussed benefits of exercise - ask MD about water aerobics/swimming- [facility]   Follow up in 1 month.   JGentry Fitz RN, BA, MGene Autry CRoseburg NorthDirect Dial:  [phone] Fax:  [phone]-mail: [email or web address] [address] [place]verton [place]  [address]

## 2015-11-14 DIAGNOSIS — Z6833 Body mass index (BMI) 33.0-33.9, adult: Secondary | ICD-10-CM | POA: Diagnosis not present

## 2015-11-14 DIAGNOSIS — M48062 Spinal stenosis, lumbar region with neurogenic claudication: Secondary | ICD-10-CM | POA: Diagnosis not present

## 2015-11-30 DIAGNOSIS — H524 Presbyopia: Secondary | ICD-10-CM | POA: Diagnosis not present

## 2015-12-05 DIAGNOSIS — M48062 Spinal stenosis, lumbar region with neurogenic claudication: Secondary | ICD-10-CM | POA: Diagnosis not present

## 2015-12-05 DIAGNOSIS — Z6833 Body mass index (BMI) 33.0-33.9, adult: Secondary | ICD-10-CM | POA: Diagnosis not present

## 2015-12-17 ENCOUNTER — Other Ambulatory Visit: Payer: Self-pay

## 2015-12-17 VITALS — BP 126/72 | Ht 72.0 in | Wt 267.1 lb

## 2015-12-17 DIAGNOSIS — E119 Type 2 diabetes mellitus without complications: Secondary | ICD-10-CM

## 2015-12-17 DIAGNOSIS — Z794 Long term (current) use of insulin: Principal | ICD-10-CM

## 2015-12-17 NOTE — Patient Outreach (Signed)
[facility]) Care Management  [place]  12/17/2015   Jon Hall 1970/12/11 [id number]  Subjective:  Met with patient in the Link to Wellness diabetes program.  Jon Hall brings his meter with him; his 14 day average is 30 mg/dl lower than they were last month when we met. He has not had any hypoglycemia and only 2 blood sugars greater than 271m/dl. He is actively working on making better dietary changes and limiting his intake.  He has decreased the amount of [place] 50 he's drinking and has switched to CBrunswick Corporationor diet soda.  Fasting blood sugars 83-2044mdl but generally around 13029ml, after meal blood sugars 103-208m64m. He has little back pain today.   Objective:  Vitals:   12/17/15 1035  BP: 126/72  Weight: 267 lb 1.6 oz (121.2 kg)  Height: 1.829 m (6')     Encounter Medications:  Outpatient Encounter Prescriptions as of 12/17/2015  Medication Sig Note  . amLODipine (NORVASC) 5 MG tablet Take 5 mg by mouth daily.   . Cyanocobalamin (VITAMIN B-12) 3000 MCG SUBL Place 1 tablet under the tongue every morning.   . dapagliflozin propanediol (FARXIGA) 5 MG TABS tablet Take 10 mg by mouth. Reported on 07/10/2015 08/23/2014: Received from: [facility] 160 MG tablet Take 160 mg by mouth daily.   . glMarland Kitchencose blood (BAYER CONTOUR NEXT TEST) test strip Use once daily. Use to check blood sugar twice a day 08/23/2014: [facility] aspart (NOVOLOG 70/30 MIX) (70-30) 100 UNIT/ML FlexPen Inject 60 Units into the skin daily with breakfast.  08/23/2014: [facility] prot[facility]-insulin aspart (NOVOLOG 70/30) (70-30) 100 UNIT/ML injection Inject 70 Units into the skin daily with supper. Patient is taking 65 units.   . liMarland Kitcheninopril (PRINIVIL,ZESTRIL) 40 MG tablet Take 40 mg by mouth daily.   . metFORMIN (GLUCOPHAGE) 1000 MG tablet  Take by mouth. Reported on 07/10/2015 08/23/2014: Received from: [facility] Hillsomega-3 acid ethyl esters (LOVAZA) 1 G capsule Take 1,000 mg by mouth 2 (two) times daily. Reported on 07/10/2015   . oxyCODONE-acetaminophen (PERCOCET/ROXICET) 5-325 MG per tablet Take 1 tablet by mouth every 4 (four) hours as needed for pain.   . rosuvastatin (CRESTOR) 20 MG tablet Take 20 mg by mouth daily.   . amMarland KitchenODipine (NORVASC) 5 MG tablet Take by mouth. Reported on 07/10/2015 08/23/2014: Received from: Duke[place] Lakedapagliflozin propanediol (FARXIGA) 5 MG TABS tablet Take 5 mg by mouth daily.   . diazepam (VALIUM) 5 MG tablet Take 1 tablet (5 mg total) by mouth every 6 (six) hours as needed (Muscle spasms). (Patient not taking: Reported on 12/17/2015)   . fenofibrate 160 MG tablet Take by mouth. Reported on 07/10/2015 08/23/2014: Received from: DukeNatronaglimepiride (AMARYL) 4 MG tablet Take 4 mg by mouth daily before breakfast.   . glimepiride (AMARYL) 4 MG tablet Take by mouth. Reported on 07/10/2015 08/23/2014: Received from: DukeCasnoviahydrOXYzine (ATARAX/VISTARIL) 50 MG tablet Take 1 tablet (50 mg total) by mouth every 4 (four) hours as needed (Nausea). (Patient not taking: Reported on 12/17/2015)   . lisinopril (PRINIVIL,ZESTRIL) 40 MG tablet Take by mouth. Reported on 07/10/2015 08/23/2014: Received from: DukeLeedsmetFORMIN (GLUMETZA) 1000 MG (MOD) 24 hr tablet Take 1,000 mg by mouth 2 (two) times  daily with a meal.   . omega-3 acid ethyl esters (LOVAZA) 1 G capsule Take 3 tablets in the morning and 3 tablet in the evening 08/23/2014: [facility]  . Omega-3 Fatty Acids (FISH OIL) 1000 MG CAPS Take 3,000 mg by mouth daily. Reported on 07/10/2015 05/10/2014: Taking 3088m/day  . oxyCODONE-acetaminophen (PERCOCET/ROXICET) 5-325 MG per tablet Take 1-2 tablets by mouth every 4 (four) hours as  needed for pain. (Patient not taking: Reported on 12/17/2015)   . oxyCODONE-acetaminophen (PERCOCET/ROXICET) 5-325 MG per tablet Take by mouth. Reported on 07/10/2015 08/23/2014: Received from: [place] . rosuvastatin (CRESTOR) 20 MG tablet Take by mouth. Reported on 07/10/2015 08/23/2014: Received from: [place]  No facility-administered encounter medications on file as of 12/17/2015.     Functional Status:  In your present state of health, do you have any difficulty performing the following activities: 07/10/2015  Hearing? N  Vision? N  Difficulty concentrating or making decisions? N  Walking or climbing stairs? Y  Dressing or bathing? Y  Doing errands, shopping? Y  Preparing Food and eating ? N  Using the Toilet? Y  Managing your Medications? N  Housekeeping or managing your Housekeeping? Y  Some recent data might be hidden    Fall/Depression Screening: PHQ 2/9 Scores 12/17/2015 11/12/2015 10/15/2015 07/10/2015 12/01/2014 05/10/2014  PHQ - 2 Score 0 0 0 0 0 0    Assessment: Very engaged, eager to learn; asks numerous questions regarding meal planning, food portions and timing. Taking blood sugars regularly.  Takes all medications as ordered.  Pain medication doctor has encouraged him to take adequate pain medication to manage pain.  We had a long discussion about exercise- strongly encouraged to do short amounts of water exercises (as MD allows) a few times a week.   Patient has not seen a cardiologist- encouraged if a baseline cardiology visit is warranted.   Plan:  [facility]CM Care Plan Problem One   Flowsheet Row Most Recent Value  Care Plan Problem One  Elevated A1C  Role Documenting the Problem One  Care Management Coordinator  Care Plan for Problem One  Active  THN Long Term Goal (31-90 days)  -improve A1C to less than 7.0% in 3 months     THN Long Term Goal Start Date  10/15/15  Interventions for Problem One Long Term Goal  Reviewed the  importance of checking food labels for sodium, and carbohydrates- reviewed typical restraunt favorites. 2. discussed the importance of timing of insulin to breakfast and supper to obtain maximum benefit 3. Discussed the advantage of exercise and recommendations from pain medication doctor to take pain medication more often 4. encouraged small episodes of exercise each day to improve cardiovascular health, improve muscle tone and aid in blood sugar control.     LNyjahwill register in the WFinley Pointapp as part of Link to Wellness for 2018.   JGentry Fitz RN, BA, MSchoenchen CEvergreenDirect Dial:  [phone] Fax:  [phone]-mail: [email or web address] [address] BPitts [place]  [address]

## 2016-01-17 DIAGNOSIS — R809 Proteinuria, unspecified: Secondary | ICD-10-CM | POA: Diagnosis not present

## 2016-01-17 DIAGNOSIS — Z794 Long term (current) use of insulin: Secondary | ICD-10-CM | POA: Diagnosis not present

## 2016-01-17 DIAGNOSIS — E1129 Type 2 diabetes mellitus with other diabetic kidney complication: Secondary | ICD-10-CM | POA: Diagnosis not present

## 2016-01-21 DIAGNOSIS — E1129 Type 2 diabetes mellitus with other diabetic kidney complication: Secondary | ICD-10-CM | POA: Diagnosis not present

## 2016-01-21 DIAGNOSIS — M48062 Spinal stenosis, lumbar region with neurogenic claudication: Secondary | ICD-10-CM | POA: Diagnosis not present

## 2016-01-21 DIAGNOSIS — E782 Mixed hyperlipidemia: Secondary | ICD-10-CM | POA: Diagnosis not present

## 2016-01-21 DIAGNOSIS — R809 Proteinuria, unspecified: Secondary | ICD-10-CM | POA: Diagnosis not present

## 2016-01-24 DIAGNOSIS — E782 Mixed hyperlipidemia: Secondary | ICD-10-CM | POA: Diagnosis not present

## 2016-01-24 DIAGNOSIS — E6609 Other obesity due to excess calories: Secondary | ICD-10-CM | POA: Diagnosis not present

## 2016-01-24 DIAGNOSIS — E1165 Type 2 diabetes mellitus with hyperglycemia: Secondary | ICD-10-CM | POA: Diagnosis not present

## 2016-01-24 DIAGNOSIS — E1129 Type 2 diabetes mellitus with other diabetic kidney complication: Secondary | ICD-10-CM | POA: Diagnosis not present

## 2016-03-20 DIAGNOSIS — I10 Essential (primary) hypertension: Secondary | ICD-10-CM | POA: Diagnosis not present

## 2016-03-20 DIAGNOSIS — M48062 Spinal stenosis, lumbar region with neurogenic claudication: Secondary | ICD-10-CM | POA: Diagnosis not present

## 2016-03-20 DIAGNOSIS — M961 Postlaminectomy syndrome, not elsewhere classified: Secondary | ICD-10-CM | POA: Diagnosis not present

## 2016-03-20 DIAGNOSIS — Z6833 Body mass index (BMI) 33.0-33.9, adult: Secondary | ICD-10-CM | POA: Diagnosis not present

## 2016-04-09 DIAGNOSIS — Z6834 Body mass index (BMI) 34.0-34.9, adult: Secondary | ICD-10-CM | POA: Diagnosis not present

## 2016-04-09 DIAGNOSIS — M961 Postlaminectomy syndrome, not elsewhere classified: Secondary | ICD-10-CM | POA: Diagnosis not present

## 2016-04-15 DIAGNOSIS — Z125 Encounter for screening for malignant neoplasm of prostate: Secondary | ICD-10-CM | POA: Diagnosis not present

## 2016-04-15 DIAGNOSIS — E538 Deficiency of other specified B group vitamins: Secondary | ICD-10-CM | POA: Diagnosis not present

## 2016-04-15 DIAGNOSIS — Z794 Long term (current) use of insulin: Secondary | ICD-10-CM | POA: Diagnosis not present

## 2016-04-15 DIAGNOSIS — E1129 Type 2 diabetes mellitus with other diabetic kidney complication: Secondary | ICD-10-CM | POA: Diagnosis not present

## 2016-04-15 DIAGNOSIS — E782 Mixed hyperlipidemia: Secondary | ICD-10-CM | POA: Diagnosis not present

## 2016-04-21 DIAGNOSIS — I10 Essential (primary) hypertension: Secondary | ICD-10-CM | POA: Diagnosis not present

## 2016-04-21 DIAGNOSIS — Z0000 Encounter for general adult medical examination without abnormal findings: Secondary | ICD-10-CM | POA: Diagnosis not present

## 2016-04-21 DIAGNOSIS — E1129 Type 2 diabetes mellitus with other diabetic kidney complication: Secondary | ICD-10-CM | POA: Diagnosis not present

## 2016-04-21 DIAGNOSIS — M5136 Other intervertebral disc degeneration, lumbar region: Secondary | ICD-10-CM | POA: Diagnosis not present

## 2016-05-23 DIAGNOSIS — R809 Proteinuria, unspecified: Secondary | ICD-10-CM | POA: Diagnosis not present

## 2016-05-23 DIAGNOSIS — E1129 Type 2 diabetes mellitus with other diabetic kidney complication: Secondary | ICD-10-CM | POA: Diagnosis not present

## 2016-05-23 DIAGNOSIS — E782 Mixed hyperlipidemia: Secondary | ICD-10-CM | POA: Diagnosis not present

## 2016-05-23 DIAGNOSIS — Z794 Long term (current) use of insulin: Secondary | ICD-10-CM | POA: Diagnosis not present

## 2016-06-12 DIAGNOSIS — I10 Essential (primary) hypertension: Secondary | ICD-10-CM | POA: Diagnosis not present

## 2016-06-12 DIAGNOSIS — M961 Postlaminectomy syndrome, not elsewhere classified: Secondary | ICD-10-CM | POA: Diagnosis not present

## 2016-06-12 DIAGNOSIS — M48062 Spinal stenosis, lumbar region with neurogenic claudication: Secondary | ICD-10-CM | POA: Diagnosis not present

## 2016-06-12 DIAGNOSIS — Z6834 Body mass index (BMI) 34.0-34.9, adult: Secondary | ICD-10-CM | POA: Diagnosis not present

## 2016-07-10 DIAGNOSIS — M48062 Spinal stenosis, lumbar region with neurogenic claudication: Secondary | ICD-10-CM | POA: Diagnosis not present

## 2016-09-22 DIAGNOSIS — I10 Essential (primary) hypertension: Secondary | ICD-10-CM | POA: Diagnosis not present

## 2016-09-22 DIAGNOSIS — Z6834 Body mass index (BMI) 34.0-34.9, adult: Secondary | ICD-10-CM | POA: Diagnosis not present

## 2016-09-22 DIAGNOSIS — M961 Postlaminectomy syndrome, not elsewhere classified: Secondary | ICD-10-CM | POA: Diagnosis not present

## 2016-09-22 DIAGNOSIS — M48062 Spinal stenosis, lumbar region with neurogenic claudication: Secondary | ICD-10-CM | POA: Diagnosis not present

## 2016-10-22 DIAGNOSIS — M961 Postlaminectomy syndrome, not elsewhere classified: Secondary | ICD-10-CM | POA: Diagnosis not present

## 2016-11-07 DIAGNOSIS — Z794 Long term (current) use of insulin: Secondary | ICD-10-CM | POA: Diagnosis not present

## 2016-11-07 DIAGNOSIS — E1129 Type 2 diabetes mellitus with other diabetic kidney complication: Secondary | ICD-10-CM | POA: Diagnosis not present

## 2016-11-07 DIAGNOSIS — M5136 Other intervertebral disc degeneration, lumbar region: Secondary | ICD-10-CM | POA: Diagnosis not present

## 2016-11-07 DIAGNOSIS — I10 Essential (primary) hypertension: Secondary | ICD-10-CM | POA: Diagnosis not present

## 2016-11-07 DIAGNOSIS — R809 Proteinuria, unspecified: Secondary | ICD-10-CM | POA: Diagnosis not present

## 2016-11-21 DIAGNOSIS — E782 Mixed hyperlipidemia: Secondary | ICD-10-CM | POA: Diagnosis not present

## 2016-11-21 DIAGNOSIS — E1129 Type 2 diabetes mellitus with other diabetic kidney complication: Secondary | ICD-10-CM | POA: Diagnosis not present

## 2016-11-21 DIAGNOSIS — Z23 Encounter for immunization: Secondary | ICD-10-CM | POA: Diagnosis not present

## 2016-11-21 DIAGNOSIS — R809 Proteinuria, unspecified: Secondary | ICD-10-CM | POA: Diagnosis not present

## 2016-12-03 DIAGNOSIS — R809 Proteinuria, unspecified: Secondary | ICD-10-CM | POA: Diagnosis not present

## 2016-12-03 DIAGNOSIS — E1129 Type 2 diabetes mellitus with other diabetic kidney complication: Secondary | ICD-10-CM | POA: Diagnosis not present

## 2016-12-03 DIAGNOSIS — I10 Essential (primary) hypertension: Secondary | ICD-10-CM | POA: Diagnosis not present

## 2016-12-03 DIAGNOSIS — M5136 Other intervertebral disc degeneration, lumbar region: Secondary | ICD-10-CM | POA: Diagnosis not present

## 2016-12-18 DIAGNOSIS — M48062 Spinal stenosis, lumbar region with neurogenic claudication: Secondary | ICD-10-CM | POA: Diagnosis not present

## 2016-12-18 DIAGNOSIS — M961 Postlaminectomy syndrome, not elsewhere classified: Secondary | ICD-10-CM | POA: Diagnosis not present

## 2016-12-31 ENCOUNTER — Other Ambulatory Visit: Payer: Self-pay | Admitting: Neurosurgery

## 2016-12-31 DIAGNOSIS — M48062 Spinal stenosis, lumbar region with neurogenic claudication: Secondary | ICD-10-CM

## 2017-01-10 ENCOUNTER — Ambulatory Visit
Admission: RE | Admit: 2017-01-10 | Discharge: 2017-01-10 | Disposition: A | Discharge status: Home / Self Care | Payer: 59 | Source: Ambulatory Visit | Attending: Neurosurgery | Admitting: Neurosurgery

## 2017-01-10 DIAGNOSIS — M48062 Spinal stenosis, lumbar region with neurogenic claudication: Secondary | ICD-10-CM

## 2017-01-10 DIAGNOSIS — M5116 Intervertebral disc disorders with radiculopathy, lumbar region: Secondary | ICD-10-CM | POA: Diagnosis not present

## 2017-01-10 MED ORDER — GADOBENATE DIMEGLUMINE 529 MG/ML IV SOLN
20.0000 mL | Freq: Once | INTRAVENOUS | Status: AC | PRN
  Administered 2017-01-10: 20 mL via INTRAVENOUS

## 2017-01-14 NOTE — Patient Outreach (Signed)
[facility]) Care Management  01/14/2017  Jon Hall 1971-01-19 [id number]   Patient is actively involved in the online [facility] diabetes program. I will discharge him from the Link to Wellness program but continue to manage him in the wellsmith program.   Susette RacerJulie Vara Mairena, RN, BA, MHA, CDE [facility] Diabetes Coordinator- Link To Wellness Direct Dial:  [phone]  Fax:  [phone] E-mail: [email or web address] [address], [place], [place]  [phone]

## 2017-01-15 DIAGNOSIS — I10 Essential (primary) hypertension: Secondary | ICD-10-CM | POA: Diagnosis not present

## 2017-01-15 DIAGNOSIS — M48062 Spinal stenosis, lumbar region with neurogenic claudication: Secondary | ICD-10-CM | POA: Diagnosis not present

## 2017-01-15 DIAGNOSIS — Z6834 Body mass index (BMI) 34.0-34.9, adult: Secondary | ICD-10-CM | POA: Diagnosis not present

## 2017-02-25 DIAGNOSIS — Z6834 Body mass index (BMI) 34.0-34.9, adult: Secondary | ICD-10-CM | POA: Diagnosis not present

## 2017-02-25 DIAGNOSIS — M48062 Spinal stenosis, lumbar region with neurogenic claudication: Secondary | ICD-10-CM | POA: Diagnosis not present

## 2017-03-17 DIAGNOSIS — Z6834 Body mass index (BMI) 34.0-34.9, adult: Secondary | ICD-10-CM | POA: Diagnosis not present

## 2017-03-17 DIAGNOSIS — M48062 Spinal stenosis, lumbar region with neurogenic claudication: Secondary | ICD-10-CM | POA: Diagnosis not present

## 2017-03-17 DIAGNOSIS — I10 Essential (primary) hypertension: Secondary | ICD-10-CM | POA: Diagnosis not present

## 2017-05-27 DIAGNOSIS — M5136 Other intervertebral disc degeneration, lumbar region: Secondary | ICD-10-CM | POA: Diagnosis not present

## 2017-05-27 DIAGNOSIS — E782 Mixed hyperlipidemia: Secondary | ICD-10-CM | POA: Diagnosis not present

## 2017-05-27 DIAGNOSIS — I10 Essential (primary) hypertension: Secondary | ICD-10-CM | POA: Diagnosis not present

## 2017-05-27 DIAGNOSIS — E538 Deficiency of other specified B group vitamins: Secondary | ICD-10-CM | POA: Diagnosis not present

## 2017-05-27 DIAGNOSIS — Z125 Encounter for screening for malignant neoplasm of prostate: Secondary | ICD-10-CM | POA: Diagnosis not present

## 2017-05-27 DIAGNOSIS — R809 Proteinuria, unspecified: Secondary | ICD-10-CM | POA: Diagnosis not present

## 2017-05-27 DIAGNOSIS — Z794 Long term (current) use of insulin: Secondary | ICD-10-CM | POA: Diagnosis not present

## 2017-05-27 DIAGNOSIS — E1129 Type 2 diabetes mellitus with other diabetic kidney complication: Secondary | ICD-10-CM | POA: Diagnosis not present

## 2017-06-03 DIAGNOSIS — Z0000 Encounter for general adult medical examination without abnormal findings: Secondary | ICD-10-CM | POA: Diagnosis not present

## 2017-06-03 DIAGNOSIS — M5136 Other intervertebral disc degeneration, lumbar region: Secondary | ICD-10-CM | POA: Diagnosis not present

## 2017-06-03 DIAGNOSIS — I10 Essential (primary) hypertension: Secondary | ICD-10-CM | POA: Diagnosis not present

## 2017-06-03 DIAGNOSIS — E1129 Type 2 diabetes mellitus with other diabetic kidney complication: Secondary | ICD-10-CM | POA: Diagnosis not present

## 2017-06-10 DIAGNOSIS — I10 Essential (primary) hypertension: Secondary | ICD-10-CM | POA: Diagnosis not present

## 2017-06-10 DIAGNOSIS — M48062 Spinal stenosis, lumbar region with neurogenic claudication: Secondary | ICD-10-CM | POA: Diagnosis not present

## 2017-06-10 DIAGNOSIS — Z6834 Body mass index (BMI) 34.0-34.9, adult: Secondary | ICD-10-CM | POA: Diagnosis not present

## 2017-06-10 DIAGNOSIS — M961 Postlaminectomy syndrome, not elsewhere classified: Secondary | ICD-10-CM | POA: Diagnosis not present

## 2017-06-22 DIAGNOSIS — Z794 Long term (current) use of insulin: Secondary | ICD-10-CM | POA: Diagnosis not present

## 2017-06-22 DIAGNOSIS — R809 Proteinuria, unspecified: Secondary | ICD-10-CM | POA: Diagnosis not present

## 2017-06-22 DIAGNOSIS — E1129 Type 2 diabetes mellitus with other diabetic kidney complication: Secondary | ICD-10-CM | POA: Diagnosis not present

## 2017-06-22 DIAGNOSIS — E781 Pure hyperglyceridemia: Secondary | ICD-10-CM | POA: Diagnosis not present

## 2017-06-25 DIAGNOSIS — Z6835 Body mass index (BMI) 35.0-35.9, adult: Secondary | ICD-10-CM | POA: Diagnosis not present

## 2017-06-25 DIAGNOSIS — M961 Postlaminectomy syndrome, not elsewhere classified: Secondary | ICD-10-CM | POA: Diagnosis not present

## 2017-06-25 DIAGNOSIS — I10 Essential (primary) hypertension: Secondary | ICD-10-CM | POA: Diagnosis not present

## 2017-07-01 DIAGNOSIS — E782 Mixed hyperlipidemia: Secondary | ICD-10-CM | POA: Diagnosis not present

## 2017-07-01 DIAGNOSIS — E538 Deficiency of other specified B group vitamins: Secondary | ICD-10-CM | POA: Diagnosis not present

## 2017-07-01 DIAGNOSIS — D518 Other vitamin B12 deficiency anemias: Secondary | ICD-10-CM | POA: Diagnosis not present

## 2017-09-08 DIAGNOSIS — M48062 Spinal stenosis, lumbar region with neurogenic claudication: Secondary | ICD-10-CM | POA: Diagnosis not present

## 2017-09-08 DIAGNOSIS — M961 Postlaminectomy syndrome, not elsewhere classified: Secondary | ICD-10-CM | POA: Diagnosis not present

## 2017-09-08 DIAGNOSIS — Z6835 Body mass index (BMI) 35.0-35.9, adult: Secondary | ICD-10-CM | POA: Diagnosis not present

## 2017-09-08 DIAGNOSIS — I10 Essential (primary) hypertension: Secondary | ICD-10-CM | POA: Diagnosis not present

## 2017-11-09 DIAGNOSIS — K625 Hemorrhage of anus and rectum: Secondary | ICD-10-CM | POA: Diagnosis not present

## 2017-11-10 ENCOUNTER — Ambulatory Visit (INDEPENDENT_AMBULATORY_CARE_PROVIDER_SITE_OTHER): Payer: 59 | Admitting: General Surgery

## 2017-11-10 VITALS — BP 132/72 | HR 82 | Resp 14 | Ht 73.0 in | Wt 270.0 lb

## 2017-11-10 DIAGNOSIS — K625 Hemorrhage of anus and rectum: Secondary | ICD-10-CM

## 2017-11-10 DIAGNOSIS — K603 Anal fistula: Secondary | ICD-10-CM | POA: Diagnosis not present

## 2017-11-10 LAB — POC HEMOCCULT BLD/STL (OFFICE/1-CARD/DIAGNOSTIC): FECAL OCCULT BLD: NEGATIVE

## 2017-11-10 MED ORDER — DOXYCYCLINE HYCLATE 100 MG PO CAPS: 100.0000 mg | ORAL_CAPSULE | Freq: Two times a day (BID) | ORAL | 0 refills | Status: AC

## 2017-11-10 NOTE — Patient Instructions (Addendum)
Rx sent to [facility]. Return in two weeks. Discuss colonoscopy at next visit. Take a cap of Miralax daily. The patient is aware to call back for any questions or concerns.

## 2017-11-10 NOTE — Progress Notes (Addendum)
Patient ID: Jon Hall, male   DOB: 03-12-1970, 47 y.o.   MRN: [id number]  Chief Complaint  Patient presents with  . Other    HPI Jon Hall is a 47 y.o. male here today for rectal bleeding. He states he has had hemorrhoid for about a year, presenting with intermittent bleeding.  On at least one occasion this was dark blood.. No bleeding in the past. The bleeding started at the begging on the year. In the last two weeks the bleeding has got worst. Bright red blood, no pain with bowl movements. On the paper and in the bowel. He has noticed discharge from his rectal area.  Uses stool softener.   The patient has been on narcotics since his last back operation.  He occasionally has episodes of constipation. HPI  Past Medical History:  Diagnosis Date  . Arthritis    back  . Diabetes mellitus without complication (HCC)   . Hyperlipidemia   . Hypertension   . Pancreatitis 2006   high triglycerides    Past Surgical History:  Procedure Laterality Date  . arthroscopic knee Bilateral   . BACK SURGERY     4 times    Family History  Problem Relation Age of Onset  . Aneurysm Father     Social History Social History   Tobacco Use  . Smoking status: Former Smoker    Packs/day: 0.50    Years: 10.00    Pack years: 5.00    Types: Cigarettes    Last attempt to quit: 02/03/2010    Years since quitting: 7.7  . Smokeless tobacco: Never Used  Substance Use Topics  . Alcohol use: No  . Drug use: No    Allergies  Allergen Reactions  . Acyclovir And Related     Current Outpatient Medications  Medication Sig Dispense Refill  . amLODipine (NORVASC) 5 MG tablet Take by mouth. Reported on 07/10/2015    . Cyanocobalamin (VITAMIN B-12) 3000 MCG SUBL Place 1 tablet under the tongue every morning.    . dapagliflozin propanediol (FARXIGA) 5 MG TABS tablet Take 10 mg by mouth. Reported on 07/10/2015    . fenofibrate 160 MG tablet Take 160 mg by mouth daily.    . fenofibrate 160 MG  tablet Take by mouth. Reported on 07/10/2015    . glucose blood (BAYER CONTOUR NEXT TEST) test strip Use once daily. Use to check blood sugar twice a day    . insulin aspart protamine-insulin aspart (NOVOLOG 70/30) (70-30) 100 UNIT/ML injection Inject 70 Units into the skin daily with supper. Patient is taking 65 units.    Marland Kitchen lisinopril (PRINIVIL,ZESTRIL) 40 MG tablet Take by mouth. Reported on 07/10/2015    . metFORMIN (GLUMETZA) 1000 MG (MOD) 24 hr tablet Take 1,000 mg by mouth 2 (two) times daily with a meal.    . omega-3 acid ethyl esters (LOVAZA) 1 G capsule Take 3 tablets in the morning and 3 tablet in the evening    . oxyCODONE-acetaminophen (PERCOCET/ROXICET) 5-325 MG per tablet Take 1-2 tablets by mouth every 4 (four) hours as needed for pain. 60 tablet 0  . rosuvastatin (CRESTOR) 20 MG tablet Take 20 mg by mouth daily.    . rosuvastatin (CRESTOR) 20 MG tablet Take by mouth. Reported on 07/10/2015    . doxycycline (VIBRAMYCIN) 100 MG capsule Take 1 capsule (100 mg total) by mouth 2 (two) times daily. 28 capsule 0   No current facility-administered medications for this visit.  Review of Systems Review of Systems  Respiratory: Negative.   Cardiovascular: Negative.   Gastrointestinal: Positive for anal bleeding.    Blood pressure 132/72, pulse 82, resp. rate 14, height 6\' 1"  (1.854 m), weight 270 lb (122.5 kg).  Physical Exam Physical Exam  Constitutional: He is oriented to person, place, and time. He appears well-developed and well-nourished.  Eyes: Conjunctivae are normal. No scleral icterus.  Neck: Normal range of motion.  Cardiovascular: Normal rate, regular rhythm and normal heart sounds.  Pulmonary/Chest: Effort normal and breath sounds normal.  Abdominal: Soft.  Genitourinary: Rectal exam shows guaiac positive stool. Rectal exam shows anal tone normal.    Prostate is not enlarged and not tender.  Lymphadenopathy:    He has no cervical adenopathy.  Neurological: He is  alert and oriented to person, place, and time.  Skin: Skin is warm and dry.    Data Reviewed Anoscopy showed nonbleeding internal hemorrhoids.  Small area visualized were lower rectal mucosa was normal.  Internal opening from suspected anal fissure not appreciated.  Assessment    History of rectal bleeding, likely anorectal source.  Past history of dark stools in the past.  Candidate for screening colonoscopy.    Plan  With a short history of the perianal purulent drainage, this may resolve with oral antibiotics.  The patient will be placed on doxycycline 100 mg p.o. twice daily.  The importance of avoiding sun exposure was discussed.  No indication for hemorrhoid banding at this time.  Rx sent to [facility]. Return in two weeks. Discuss colonoscopy at next visit. Take a cap of Miralax daily. The patient is aware to call back for any questions or concerns.     .HPI, Physical Exam, Assessment and Plan have been scribed under the direction and in the presence of Donnalee Curry, MD.  Ples Specter, CMA  I have completed the exam and reviewed the above documentation for accuracy and completeness.  I agree with the above.  Museum/gallery conservator has been used and any errors in dictation or transcription are unintentional.  Donnalee Curry, M.D., F.A.C.S.  Jon Hall 11/10/2017, 8:50 PM

## 2017-11-24 ENCOUNTER — Encounter: Payer: Self-pay | Admitting: General Surgery

## 2017-11-24 ENCOUNTER — Other Ambulatory Visit: Payer: Self-pay

## 2017-11-24 ENCOUNTER — Ambulatory Visit (INDEPENDENT_AMBULATORY_CARE_PROVIDER_SITE_OTHER): Payer: 59 | Admitting: General Surgery

## 2017-11-24 VITALS — BP 132/72 | HR 82 | Resp 13 | Ht 73.0 in | Wt 263.0 lb

## 2017-11-24 DIAGNOSIS — K603 Anal fistula: Secondary | ICD-10-CM | POA: Diagnosis not present

## 2017-11-24 DIAGNOSIS — K5903 Drug induced constipation: Secondary | ICD-10-CM | POA: Diagnosis not present

## 2017-11-24 NOTE — Progress Notes (Signed)
Patient ID: Jon Hall, male   DOB: [birth year], 47 y.o.   MRN: [id number]  Chief Complaint  Patient presents with  . Other    HPI Jon Hall is a 47 y.o. male here today for his follow up retcal bleeding. Patient states his bleeding about a week ago. Moves bowels daily.  Occasional hard stools, especially if he is made use of narcotics for his back pain. HPI  Past Medical History:  Diagnosis Date  . Arthritis    back  . Diabetes mellitus without complication (HCC)   . Hyperlipidemia   . Hypertension   . Pancreatitis 2006   high triglycerides    Past Surgical History:  Procedure Laterality Date  . arthroscopic knee Bilateral   . BACK SURGERY     4 times    Family History  Problem Relation Age of Onset  . Aneurysm Father     Social History Social History   Tobacco Use  . Smoking status: Former Smoker    Packs/day: 0.50    Years: 10.00    Pack years: 5.00    Types: Cigarettes    Last attempt to quit: 02/03/2010    Years since quitting: 7.8  . Smokeless tobacco: Never Used  Substance Use Topics  . Alcohol use: No  . Drug use: No    Allergies  Allergen Reactions  . Acyclovir And Related     Current Outpatient Medications  Medication Sig Dispense Refill  . amLODipine (NORVASC) 5 MG tablet Take by mouth. Reported on 07/10/2015    . Cyanocobalamin (VITAMIN B-12) 3000 MCG SUBL Place 1 tablet under the tongue every morning.    . dapagliflozin propanediol (FARXIGA) 5 MG TABS tablet Take 10 mg by mouth. Reported on 07/10/2015    . doxycycline (VIBRAMYCIN) 100 MG capsule Take 1 capsule (100 mg total) by mouth 2 (two) times daily. 28 capsule 0  . fenofibrate 160 MG tablet Take 160 mg by mouth daily.    . fenofibrate 160 MG tablet Take by mouth. Reported on 07/10/2015    . glucose blood (BAYER CONTOUR NEXT TEST) test strip Use once daily. Use to check blood sugar twice a day    . insulin aspart protamine-insulin aspart (NOVOLOG 70/30) (70-30) 100 UNIT/ML injection  Inject 70 Units into the skin daily with supper. Patient is taking 65 units.    Marland Kitchen lisinopril (PRINIVIL,ZESTRIL) 40 MG tablet Take by mouth. Reported on 07/10/2015    . metFORMIN (GLUMETZA) 1000 MG (MOD) 24 hr tablet Take 1,000 mg by mouth 2 (two) times daily with a meal.    . omega-3 acid ethyl esters (LOVAZA) 1 G capsule Take 3 tablets in the morning and 3 tablet in the evening    . oxyCODONE-acetaminophen (PERCOCET/ROXICET) 5-325 MG per tablet Take 1-2 tablets by mouth every 4 (four) hours as needed for pain. 60 tablet 0  . rosuvastatin (CRESTOR) 20 MG tablet Take 20 mg by mouth daily.    . rosuvastatin (CRESTOR) 20 MG tablet Take by mouth. Reported on 07/10/2015     No current facility-administered medications for this visit.     Review of Systems Review of Systems  Constitutional: Negative.   Respiratory: Negative.     Blood pressure 132/72, pulse 82, resp. rate 13, height 6\' 1"  (1.854 m), weight 263 lb (119.3 kg).  Physical Exam Physical Exam  Constitutional: He is oriented to person, place, and time. He appears well-developed and well-nourished.  Genitourinary:     Genitourinary Comments: Right anterior  fistula  Neurological: He is alert and oriented to person, place, and time.  Skin: Skin is warm and dry.       Assessment    Small, asymptomatic anal fistula, observation alone at present.  Improvement in rectal bleeding, likely related to trauma to the anorectal mucosa.  Candidate for screening colonoscopy sometime between now and age 47.    Plan  The patient will be encouraged to make use of a daily fiber supplement.  This should help minimize hard stools which are likely aggravating the perianal tissue.  If he has increasing pain, drainage or discomfort, early reassessment is been recommended and at that time fistulotomy may be appropriate.  The patient is aware to call back for any questions or concerns.  Importance of having a colonoscopy before age 47 was  reviewed.     HPI, Physical Exam, Assessment and Plan have been scribed under the direction and in the presence of Jon Curry, MD.  Jon Hall, CMA  I have completed the exam and reviewed the above documentation for accuracy and completeness.  I agree with the above.  Museum/gallery conservator has been used and any errors in dictation or transcription are unintentional.  Jon Hall, M.D., F.A.C.S.  Jon Hall 11/24/2017, 9:48 AM

## 2017-11-24 NOTE — Patient Instructions (Signed)
The patient will be encouraged to make use of a daily fiber supplement.  The patient is aware to call back for any questions or concerns.

## 2017-11-27 DIAGNOSIS — Z794 Long term (current) use of insulin: Secondary | ICD-10-CM | POA: Diagnosis not present

## 2017-11-27 DIAGNOSIS — E1129 Type 2 diabetes mellitus with other diabetic kidney complication: Secondary | ICD-10-CM | POA: Diagnosis not present

## 2017-11-27 DIAGNOSIS — R809 Proteinuria, unspecified: Secondary | ICD-10-CM | POA: Diagnosis not present

## 2017-11-27 DIAGNOSIS — E782 Mixed hyperlipidemia: Secondary | ICD-10-CM | POA: Diagnosis not present

## 2017-11-27 DIAGNOSIS — D518 Other vitamin B12 deficiency anemias: Secondary | ICD-10-CM | POA: Diagnosis not present

## 2017-11-27 DIAGNOSIS — E538 Deficiency of other specified B group vitamins: Secondary | ICD-10-CM | POA: Diagnosis not present

## 2017-11-30 DIAGNOSIS — M48062 Spinal stenosis, lumbar region with neurogenic claudication: Secondary | ICD-10-CM | POA: Diagnosis not present

## 2017-11-30 DIAGNOSIS — I10 Essential (primary) hypertension: Secondary | ICD-10-CM | POA: Diagnosis not present

## 2017-11-30 DIAGNOSIS — M961 Postlaminectomy syndrome, not elsewhere classified: Secondary | ICD-10-CM | POA: Diagnosis not present

## 2017-11-30 DIAGNOSIS — Z6834 Body mass index (BMI) 34.0-34.9, adult: Secondary | ICD-10-CM | POA: Diagnosis not present

## 2017-12-04 DIAGNOSIS — Z23 Encounter for immunization: Secondary | ICD-10-CM | POA: Diagnosis not present

## 2017-12-04 DIAGNOSIS — E1129 Type 2 diabetes mellitus with other diabetic kidney complication: Secondary | ICD-10-CM | POA: Diagnosis not present

## 2017-12-04 DIAGNOSIS — I10 Essential (primary) hypertension: Secondary | ICD-10-CM | POA: Diagnosis not present

## 2017-12-04 DIAGNOSIS — E538 Deficiency of other specified B group vitamins: Secondary | ICD-10-CM | POA: Diagnosis not present

## 2017-12-04 DIAGNOSIS — M5136 Other intervertebral disc degeneration, lumbar region: Secondary | ICD-10-CM | POA: Diagnosis not present

## 2017-12-07 DIAGNOSIS — E1129 Type 2 diabetes mellitus with other diabetic kidney complication: Secondary | ICD-10-CM | POA: Diagnosis not present

## 2017-12-07 DIAGNOSIS — R809 Proteinuria, unspecified: Secondary | ICD-10-CM | POA: Diagnosis not present

## 2017-12-07 DIAGNOSIS — E1165 Type 2 diabetes mellitus with hyperglycemia: Secondary | ICD-10-CM | POA: Diagnosis not present

## 2017-12-07 DIAGNOSIS — E781 Pure hyperglyceridemia: Secondary | ICD-10-CM | POA: Diagnosis not present

## 2017-12-16 DIAGNOSIS — M961 Postlaminectomy syndrome, not elsewhere classified: Secondary | ICD-10-CM | POA: Diagnosis not present

## 2018-03-02 DIAGNOSIS — M961 Postlaminectomy syndrome, not elsewhere classified: Secondary | ICD-10-CM | POA: Diagnosis not present

## 2018-03-02 DIAGNOSIS — I10 Essential (primary) hypertension: Secondary | ICD-10-CM | POA: Diagnosis not present

## 2018-03-02 DIAGNOSIS — Z6834 Body mass index (BMI) 34.0-34.9, adult: Secondary | ICD-10-CM | POA: Diagnosis not present

## 2018-03-07 DIAGNOSIS — J0100 Acute maxillary sinusitis, unspecified: Secondary | ICD-10-CM | POA: Diagnosis not present

## 2018-03-07 DIAGNOSIS — J209 Acute bronchitis, unspecified: Secondary | ICD-10-CM | POA: Diagnosis not present

## 2018-03-17 DIAGNOSIS — E781 Pure hyperglyceridemia: Secondary | ICD-10-CM | POA: Diagnosis not present

## 2018-03-17 DIAGNOSIS — E1165 Type 2 diabetes mellitus with hyperglycemia: Secondary | ICD-10-CM | POA: Diagnosis not present

## 2018-03-17 DIAGNOSIS — Z794 Long term (current) use of insulin: Secondary | ICD-10-CM | POA: Diagnosis not present

## 2018-03-17 DIAGNOSIS — E1129 Type 2 diabetes mellitus with other diabetic kidney complication: Secondary | ICD-10-CM | POA: Diagnosis not present

## 2018-03-17 DIAGNOSIS — R809 Proteinuria, unspecified: Secondary | ICD-10-CM | POA: Diagnosis not present

## 2018-04-15 DIAGNOSIS — M961 Postlaminectomy syndrome, not elsewhere classified: Secondary | ICD-10-CM | POA: Diagnosis not present

## 2018-05-18 DIAGNOSIS — M961 Postlaminectomy syndrome, not elsewhere classified: Secondary | ICD-10-CM | POA: Diagnosis not present

## 2018-05-18 DIAGNOSIS — M48062 Spinal stenosis, lumbar region with neurogenic claudication: Secondary | ICD-10-CM | POA: Diagnosis not present

## 2018-08-11 DIAGNOSIS — M48062 Spinal stenosis, lumbar region with neurogenic claudication: Secondary | ICD-10-CM | POA: Diagnosis not present

## 2018-08-17 DIAGNOSIS — I10 Essential (primary) hypertension: Secondary | ICD-10-CM | POA: Diagnosis not present

## 2018-08-17 DIAGNOSIS — Z6833 Body mass index (BMI) 33.0-33.9, adult: Secondary | ICD-10-CM | POA: Diagnosis not present

## 2018-08-17 DIAGNOSIS — M961 Postlaminectomy syndrome, not elsewhere classified: Secondary | ICD-10-CM | POA: Diagnosis not present

## 2018-08-17 DIAGNOSIS — M5136 Other intervertebral disc degeneration, lumbar region: Secondary | ICD-10-CM | POA: Diagnosis not present

## 2018-09-30 DIAGNOSIS — E782 Mixed hyperlipidemia: Secondary | ICD-10-CM | POA: Diagnosis not present

## 2018-09-30 DIAGNOSIS — Z125 Encounter for screening for malignant neoplasm of prostate: Secondary | ICD-10-CM | POA: Diagnosis not present

## 2018-09-30 DIAGNOSIS — E1129 Type 2 diabetes mellitus with other diabetic kidney complication: Secondary | ICD-10-CM | POA: Diagnosis not present

## 2018-09-30 DIAGNOSIS — E538 Deficiency of other specified B group vitamins: Secondary | ICD-10-CM | POA: Diagnosis not present

## 2018-09-30 DIAGNOSIS — R809 Proteinuria, unspecified: Secondary | ICD-10-CM | POA: Diagnosis not present

## 2018-09-30 DIAGNOSIS — Z794 Long term (current) use of insulin: Secondary | ICD-10-CM | POA: Diagnosis not present

## 2018-10-07 DIAGNOSIS — E1165 Type 2 diabetes mellitus with hyperglycemia: Secondary | ICD-10-CM | POA: Diagnosis not present

## 2018-10-07 DIAGNOSIS — I10 Essential (primary) hypertension: Secondary | ICD-10-CM | POA: Diagnosis not present

## 2018-10-07 DIAGNOSIS — E1129 Type 2 diabetes mellitus with other diabetic kidney complication: Secondary | ICD-10-CM | POA: Diagnosis not present

## 2018-10-07 DIAGNOSIS — Z0000 Encounter for general adult medical examination without abnormal findings: Secondary | ICD-10-CM | POA: Diagnosis not present

## 2018-10-20 DIAGNOSIS — R809 Proteinuria, unspecified: Secondary | ICD-10-CM | POA: Diagnosis not present

## 2018-10-20 DIAGNOSIS — E1165 Type 2 diabetes mellitus with hyperglycemia: Secondary | ICD-10-CM | POA: Diagnosis not present

## 2018-10-20 DIAGNOSIS — E1129 Type 2 diabetes mellitus with other diabetic kidney complication: Secondary | ICD-10-CM | POA: Diagnosis not present

## 2018-10-20 DIAGNOSIS — E781 Pure hyperglyceridemia: Secondary | ICD-10-CM | POA: Diagnosis not present

## 2018-11-11 DIAGNOSIS — Z6825 Body mass index (BMI) 25.0-25.9, adult: Secondary | ICD-10-CM | POA: Diagnosis not present

## 2018-11-11 DIAGNOSIS — M961 Postlaminectomy syndrome, not elsewhere classified: Secondary | ICD-10-CM | POA: Diagnosis not present

## 2018-12-07 DIAGNOSIS — M961 Postlaminectomy syndrome, not elsewhere classified: Secondary | ICD-10-CM | POA: Diagnosis not present

## 2018-12-07 DIAGNOSIS — M5136 Other intervertebral disc degeneration, lumbar region: Secondary | ICD-10-CM | POA: Diagnosis not present

## 2018-12-07 DIAGNOSIS — I10 Essential (primary) hypertension: Secondary | ICD-10-CM | POA: Diagnosis not present

## 2018-12-07 DIAGNOSIS — Z6833 Body mass index (BMI) 33.0-33.9, adult: Secondary | ICD-10-CM | POA: Diagnosis not present

## 2019-02-03 DIAGNOSIS — Z1159 Encounter for screening for other viral diseases: Secondary | ICD-10-CM | POA: Diagnosis not present

## 2019-02-03 DIAGNOSIS — E782 Mixed hyperlipidemia: Secondary | ICD-10-CM | POA: Diagnosis not present

## 2019-02-03 DIAGNOSIS — E538 Deficiency of other specified B group vitamins: Secondary | ICD-10-CM | POA: Diagnosis not present

## 2019-02-03 DIAGNOSIS — E1165 Type 2 diabetes mellitus with hyperglycemia: Secondary | ICD-10-CM | POA: Diagnosis not present

## 2019-02-08 DIAGNOSIS — I10 Essential (primary) hypertension: Secondary | ICD-10-CM | POA: Diagnosis not present

## 2019-02-08 DIAGNOSIS — M961 Postlaminectomy syndrome, not elsewhere classified: Secondary | ICD-10-CM | POA: Diagnosis not present

## 2019-02-08 DIAGNOSIS — F1120 Opioid dependence, uncomplicated: Secondary | ICD-10-CM | POA: Diagnosis not present

## 2019-02-08 DIAGNOSIS — Z6833 Body mass index (BMI) 33.0-33.9, adult: Secondary | ICD-10-CM | POA: Diagnosis not present

## 2019-02-08 DIAGNOSIS — M5136 Other intervertebral disc degeneration, lumbar region: Secondary | ICD-10-CM | POA: Diagnosis not present

## 2019-02-09 DIAGNOSIS — Z23 Encounter for immunization: Secondary | ICD-10-CM | POA: Diagnosis not present

## 2019-02-09 DIAGNOSIS — I10 Essential (primary) hypertension: Secondary | ICD-10-CM | POA: Diagnosis not present

## 2019-02-09 DIAGNOSIS — E1129 Type 2 diabetes mellitus with other diabetic kidney complication: Secondary | ICD-10-CM | POA: Diagnosis not present

## 2019-02-09 DIAGNOSIS — M5136 Other intervertebral disc degeneration, lumbar region: Secondary | ICD-10-CM | POA: Diagnosis not present

## 2019-02-09 DIAGNOSIS — E1165 Type 2 diabetes mellitus with hyperglycemia: Secondary | ICD-10-CM | POA: Diagnosis not present

## 2019-02-11 DIAGNOSIS — R809 Proteinuria, unspecified: Secondary | ICD-10-CM | POA: Diagnosis not present

## 2019-02-11 DIAGNOSIS — E781 Pure hyperglyceridemia: Secondary | ICD-10-CM | POA: Diagnosis not present

## 2019-02-11 DIAGNOSIS — E1129 Type 2 diabetes mellitus with other diabetic kidney complication: Secondary | ICD-10-CM | POA: Diagnosis not present

## 2019-02-11 DIAGNOSIS — E1165 Type 2 diabetes mellitus with hyperglycemia: Secondary | ICD-10-CM | POA: Diagnosis not present

## 2019-02-16 DIAGNOSIS — M48062 Spinal stenosis, lumbar region with neurogenic claudication: Secondary | ICD-10-CM | POA: Diagnosis not present

## 2019-03-08 ENCOUNTER — Other Ambulatory Visit: Payer: Self-pay | Admitting: Internal Medicine

## 2019-05-04 DIAGNOSIS — M48062 Spinal stenosis, lumbar region with neurogenic claudication: Secondary | ICD-10-CM | POA: Diagnosis not present

## 2019-05-04 DIAGNOSIS — M961 Postlaminectomy syndrome, not elsewhere classified: Secondary | ICD-10-CM | POA: Diagnosis not present

## 2019-05-10 ENCOUNTER — Other Ambulatory Visit: Payer: Self-pay | Admitting: Internal Medicine

## 2019-06-16 DIAGNOSIS — M961 Postlaminectomy syndrome, not elsewhere classified: Secondary | ICD-10-CM | POA: Diagnosis not present

## 2019-06-16 DIAGNOSIS — F1120 Opioid dependence, uncomplicated: Secondary | ICD-10-CM | POA: Diagnosis not present

## 2019-06-16 DIAGNOSIS — Z6833 Body mass index (BMI) 33.0-33.9, adult: Secondary | ICD-10-CM | POA: Diagnosis not present

## 2019-06-16 DIAGNOSIS — I10 Essential (primary) hypertension: Secondary | ICD-10-CM | POA: Diagnosis not present

## 2019-06-22 DIAGNOSIS — E781 Pure hyperglyceridemia: Secondary | ICD-10-CM | POA: Diagnosis not present

## 2019-06-22 DIAGNOSIS — E1165 Type 2 diabetes mellitus with hyperglycemia: Secondary | ICD-10-CM | POA: Diagnosis not present

## 2019-06-22 DIAGNOSIS — E538 Deficiency of other specified B group vitamins: Secondary | ICD-10-CM | POA: Diagnosis not present

## 2019-06-25 ENCOUNTER — Ambulatory Visit: Payer: 59 | Attending: Internal Medicine

## 2019-06-25 ENCOUNTER — Other Ambulatory Visit: Payer: Self-pay

## 2019-06-25 DIAGNOSIS — Z23 Encounter for immunization: Secondary | ICD-10-CM

## 2019-06-25 NOTE — Progress Notes (Signed)
   Covid-19 Vaccination Clinic  Name:  Jon Hall    MRN: [id number] DOB: 1970/05/26  06/25/2019  [name] was observed post Covid-19 immunization for 15 minutes without incident. He was provided with Vaccine Information Sheet and instruction to access the V-Safe system.   [name] was instructed to call 911 with any severe reactions post vaccine: Marland Kitchen Difficulty breathing  . Swelling of face and throat  . A fast heartbeat  . A bad rash all over body  . Dizziness and weakness   Immunizations Administered    Name Date Dose VIS Date Route   Pfizer COVID-19 Vaccine 06/25/2019 11:15 AM 0.3 mL 03/30/2018 Intramuscular   Manufacturer: [employer], [employer]   Lot: [id number]   NDC: [address]-9

## 2019-06-29 DIAGNOSIS — M5136 Other intervertebral disc degeneration, lumbar region: Secondary | ICD-10-CM | POA: Diagnosis not present

## 2019-06-29 DIAGNOSIS — E1129 Type 2 diabetes mellitus with other diabetic kidney complication: Secondary | ICD-10-CM | POA: Diagnosis not present

## 2019-06-29 DIAGNOSIS — E1165 Type 2 diabetes mellitus with hyperglycemia: Secondary | ICD-10-CM | POA: Diagnosis not present

## 2019-06-29 DIAGNOSIS — I10 Essential (primary) hypertension: Secondary | ICD-10-CM | POA: Diagnosis not present

## 2019-07-05 ENCOUNTER — Other Ambulatory Visit: Payer: Self-pay | Admitting: Internal Medicine

## 2019-07-05 DIAGNOSIS — E1165 Type 2 diabetes mellitus with hyperglycemia: Secondary | ICD-10-CM | POA: Diagnosis not present

## 2019-07-05 DIAGNOSIS — R809 Proteinuria, unspecified: Secondary | ICD-10-CM | POA: Diagnosis not present

## 2019-07-05 DIAGNOSIS — E1129 Type 2 diabetes mellitus with other diabetic kidney complication: Secondary | ICD-10-CM | POA: Diagnosis not present

## 2019-07-05 DIAGNOSIS — E781 Pure hyperglyceridemia: Secondary | ICD-10-CM | POA: Diagnosis not present

## 2019-07-16 ENCOUNTER — Ambulatory Visit: Payer: 59 | Attending: Internal Medicine

## 2019-07-16 ENCOUNTER — Other Ambulatory Visit: Payer: Self-pay

## 2019-07-16 DIAGNOSIS — Z23 Encounter for immunization: Secondary | ICD-10-CM

## 2019-07-16 NOTE — Progress Notes (Signed)
   Covid-19 Vaccination Clinic  Name:  Jon Hall    MRN: [id number] DOB: [birth year]  07/16/2019  Mr. Jon Hall was observed post Covid-19 immunization for 15 minutes without incident. He was provided with Vaccine Information Sheet and instruction to access the V-Safe system.   Mr. Jon Hall was instructed to call 911 with any severe reactions post vaccine: Marland Kitchen Difficulty breathing  . Swelling of face and throat  . A fast heartbeat  . A bad rash all over body  . Dizziness and weakness   Immunizations Administered    Name Date Dose VIS Date Route   Pfizer COVID-19 Vaccine 07/16/2019 12:05 PM 0.3 mL 03/30/2018 Intramuscular   Manufacturer: [employer], [employer]   Lot: [id number]   NDC: [address]-7

## 2019-07-27 DIAGNOSIS — M5412 Radiculopathy, cervical region: Secondary | ICD-10-CM | POA: Diagnosis not present

## 2019-07-28 DIAGNOSIS — M50223 Other cervical disc displacement at C6-C7 level: Secondary | ICD-10-CM | POA: Diagnosis not present

## 2019-07-28 DIAGNOSIS — M5412 Radiculopathy, cervical region: Secondary | ICD-10-CM | POA: Diagnosis not present

## 2019-07-28 DIAGNOSIS — M50221 Other cervical disc displacement at C4-C5 level: Secondary | ICD-10-CM | POA: Diagnosis not present

## 2019-07-28 DIAGNOSIS — M50222 Other cervical disc displacement at C5-C6 level: Secondary | ICD-10-CM | POA: Diagnosis not present

## 2019-08-11 DIAGNOSIS — M5412 Radiculopathy, cervical region: Secondary | ICD-10-CM | POA: Diagnosis not present

## 2019-08-31 DIAGNOSIS — M4802 Spinal stenosis, cervical region: Secondary | ICD-10-CM | POA: Diagnosis not present

## 2019-09-14 DIAGNOSIS — M5412 Radiculopathy, cervical region: Secondary | ICD-10-CM | POA: Diagnosis not present

## 2019-09-14 DIAGNOSIS — M47812 Spondylosis without myelopathy or radiculopathy, cervical region: Secondary | ICD-10-CM | POA: Diagnosis not present

## 2019-09-14 DIAGNOSIS — M50122 Cervical disc disorder at C5-C6 level with radiculopathy: Secondary | ICD-10-CM | POA: Diagnosis not present

## 2019-10-03 DIAGNOSIS — M961 Postlaminectomy syndrome, not elsewhere classified: Secondary | ICD-10-CM | POA: Diagnosis not present

## 2019-10-03 DIAGNOSIS — M5136 Other intervertebral disc degeneration, lumbar region: Secondary | ICD-10-CM | POA: Diagnosis not present

## 2019-10-26 DIAGNOSIS — M5412 Radiculopathy, cervical region: Secondary | ICD-10-CM | POA: Diagnosis not present

## 2019-11-15 ENCOUNTER — Other Ambulatory Visit: Payer: Self-pay | Admitting: Internal Medicine

## 2019-11-23 DIAGNOSIS — Z6831 Body mass index (BMI) 31.0-31.9, adult: Secondary | ICD-10-CM | POA: Diagnosis not present

## 2019-11-23 DIAGNOSIS — I10 Essential (primary) hypertension: Secondary | ICD-10-CM | POA: Diagnosis not present

## 2019-11-23 DIAGNOSIS — M5412 Radiculopathy, cervical region: Secondary | ICD-10-CM | POA: Diagnosis not present

## 2019-12-26 ENCOUNTER — Other Ambulatory Visit: Payer: Self-pay | Admitting: Neurosurgery

## 2019-12-26 DIAGNOSIS — F1120 Opioid dependence, uncomplicated: Secondary | ICD-10-CM | POA: Diagnosis not present

## 2019-12-26 DIAGNOSIS — M5136 Other intervertebral disc degeneration, lumbar region: Secondary | ICD-10-CM | POA: Diagnosis not present

## 2019-12-26 DIAGNOSIS — M961 Postlaminectomy syndrome, not elsewhere classified: Secondary | ICD-10-CM | POA: Diagnosis not present

## 2020-01-13 ENCOUNTER — Other Ambulatory Visit: Payer: Self-pay | Admitting: Internal Medicine

## 2020-01-24 ENCOUNTER — Other Ambulatory Visit: Payer: Self-pay | Admitting: Internal Medicine

## 2020-02-15 DIAGNOSIS — E781 Pure hyperglyceridemia: Secondary | ICD-10-CM | POA: Diagnosis not present

## 2020-02-15 DIAGNOSIS — Z125 Encounter for screening for malignant neoplasm of prostate: Secondary | ICD-10-CM | POA: Diagnosis not present

## 2020-02-15 DIAGNOSIS — E538 Deficiency of other specified B group vitamins: Secondary | ICD-10-CM | POA: Diagnosis not present

## 2020-02-15 DIAGNOSIS — E1165 Type 2 diabetes mellitus with hyperglycemia: Secondary | ICD-10-CM | POA: Diagnosis not present

## 2020-02-22 ENCOUNTER — Other Ambulatory Visit: Payer: Self-pay | Admitting: Internal Medicine

## 2020-02-22 DIAGNOSIS — E1129 Type 2 diabetes mellitus with other diabetic kidney complication: Secondary | ICD-10-CM | POA: Diagnosis not present

## 2020-02-22 DIAGNOSIS — R809 Proteinuria, unspecified: Secondary | ICD-10-CM | POA: Diagnosis not present

## 2020-02-22 DIAGNOSIS — Z794 Long term (current) use of insulin: Secondary | ICD-10-CM | POA: Diagnosis not present

## 2020-02-22 DIAGNOSIS — Z0000 Encounter for general adult medical examination without abnormal findings: Secondary | ICD-10-CM | POA: Diagnosis not present

## 2020-02-22 DIAGNOSIS — E1165 Type 2 diabetes mellitus with hyperglycemia: Secondary | ICD-10-CM | POA: Diagnosis not present

## 2020-02-22 DIAGNOSIS — E781 Pure hyperglyceridemia: Secondary | ICD-10-CM | POA: Diagnosis not present

## 2020-02-22 DIAGNOSIS — I10 Essential (primary) hypertension: Secondary | ICD-10-CM | POA: Diagnosis not present

## 2020-03-01 ENCOUNTER — Other Ambulatory Visit: Payer: Self-pay | Admitting: Neurosurgery

## 2020-03-01 DIAGNOSIS — M5136 Other intervertebral disc degeneration, lumbar region: Secondary | ICD-10-CM | POA: Diagnosis not present

## 2020-03-01 DIAGNOSIS — M5416 Radiculopathy, lumbar region: Secondary | ICD-10-CM

## 2020-03-14 ENCOUNTER — Other Ambulatory Visit: Payer: Self-pay | Admitting: Neurosurgery

## 2020-03-14 DIAGNOSIS — F1120 Opioid dependence, uncomplicated: Secondary | ICD-10-CM | POA: Diagnosis not present

## 2020-03-14 DIAGNOSIS — M961 Postlaminectomy syndrome, not elsewhere classified: Secondary | ICD-10-CM | POA: Diagnosis not present

## 2020-03-20 ENCOUNTER — Other Ambulatory Visit: Payer: Self-pay | Admitting: Internal Medicine

## 2020-03-27 ENCOUNTER — Other Ambulatory Visit: Payer: Self-pay

## 2020-03-27 ENCOUNTER — Ambulatory Visit
Admission: RE | Admit: 2020-03-27 | Discharge: 2020-03-27 | Disposition: A | Discharge status: Home / Self Care | Payer: 59 | Source: Ambulatory Visit | Attending: Neurosurgery | Admitting: Neurosurgery

## 2020-03-27 DIAGNOSIS — M545 Low back pain, unspecified: Secondary | ICD-10-CM | POA: Diagnosis not present

## 2020-03-27 DIAGNOSIS — M5416 Radiculopathy, lumbar region: Secondary | ICD-10-CM

## 2020-04-04 ENCOUNTER — Other Ambulatory Visit: Payer: Self-pay | Admitting: Neurosurgery

## 2020-04-04 DIAGNOSIS — Z6831 Body mass index (BMI) 31.0-31.9, adult: Secondary | ICD-10-CM | POA: Diagnosis not present

## 2020-04-04 DIAGNOSIS — I10 Essential (primary) hypertension: Secondary | ICD-10-CM | POA: Diagnosis not present

## 2020-04-04 DIAGNOSIS — M5136 Other intervertebral disc degeneration, lumbar region: Secondary | ICD-10-CM | POA: Diagnosis not present

## 2020-04-04 DIAGNOSIS — M5416 Radiculopathy, lumbar region: Secondary | ICD-10-CM | POA: Diagnosis not present

## 2020-04-04 DIAGNOSIS — M961 Postlaminectomy syndrome, not elsewhere classified: Secondary | ICD-10-CM | POA: Diagnosis not present

## 2020-04-25 ENCOUNTER — Other Ambulatory Visit: Payer: Self-pay | Admitting: Internal Medicine

## 2020-05-23 ENCOUNTER — Other Ambulatory Visit: Payer: Self-pay | Admitting: Internal Medicine

## 2020-05-23 ENCOUNTER — Other Ambulatory Visit: Payer: Self-pay

## 2020-05-23 MED FILL — Metformin HCl Tab 1000 MG: ORAL | 30 days supply | Qty: 60 | Fill #0 | Status: AC

## 2020-05-23 MED FILL — Rosuvastatin Calcium Tab 40 MG: ORAL | 90 days supply | Qty: 90 | Fill #0 | Status: AC

## 2020-05-25 ENCOUNTER — Other Ambulatory Visit: Payer: Self-pay

## 2020-05-25 MED ORDER — AMLODIPINE BESYLATE 5 MG PO TABS
ORAL_TABLET | ORAL | 1 refills | Status: DC
  Filled 2020-05-25: qty 90, 90d supply, fill #0
  Filled 2020-08-28: qty 90, 90d supply, fill #1

## 2020-05-25 MED ORDER — INSULIN LISPRO PROT & LISPRO (75-25 MIX) 100 UNIT/ML KWIKPEN
PEN_INJECTOR | SUBCUTANEOUS | 5 refills | Status: AC
  Filled 2020-05-25: qty 30, 28d supply, fill #0
  Filled 2021-01-10: qty 30, 28d supply, fill #1

## 2020-05-25 MED ORDER — SEMAGLUTIDE 14 MG PO TABS
ORAL_TABLET | ORAL | 1 refills | Status: DC
  Filled 2020-05-25: qty 30, 30d supply, fill #0
  Filled 2020-07-06: qty 30, 30d supply, fill #1

## 2020-05-25 MED ORDER — LISINOPRIL 40 MG PO TABS
ORAL_TABLET | Freq: Every day | ORAL | 1 refills | Status: AC
  Filled 2020-05-25: qty 90, 90d supply, fill #0
  Filled 2020-08-28: qty 90, 90d supply, fill #1

## 2020-05-25 MED FILL — Oxycodone w/ Acetaminophen Tab 5-325 MG: ORAL | 30 days supply | Qty: 120 | Fill #0 | Status: AC

## 2020-05-28 ENCOUNTER — Other Ambulatory Visit: Payer: Self-pay

## 2020-06-22 ENCOUNTER — Other Ambulatory Visit: Payer: Self-pay

## 2020-06-22 MED ORDER — OXYCODONE-ACETAMINOPHEN 5-325 MG PO TABS
ORAL_TABLET | ORAL | 0 refills | Status: DC
  Filled 2020-06-22: qty 120, 30d supply, fill #0

## 2020-06-22 MED FILL — Metformin HCl Tab 1000 MG: ORAL | 30 days supply | Qty: 60 | Fill #1 | Status: AC

## 2020-06-22 MED FILL — Fenofibrate Tab 160 MG: ORAL | 90 days supply | Qty: 90 | Fill #0 | Status: AC

## 2020-07-05 ENCOUNTER — Other Ambulatory Visit: Payer: Self-pay

## 2020-07-05 DIAGNOSIS — M5416 Radiculopathy, lumbar region: Secondary | ICD-10-CM | POA: Diagnosis not present

## 2020-07-05 DIAGNOSIS — M961 Postlaminectomy syndrome, not elsewhere classified: Secondary | ICD-10-CM | POA: Diagnosis not present

## 2020-07-05 DIAGNOSIS — F1120 Opioid dependence, uncomplicated: Secondary | ICD-10-CM | POA: Diagnosis not present

## 2020-07-05 MED ORDER — OXYCODONE-ACETAMINOPHEN 5-325 MG PO TABS
ORAL_TABLET | ORAL | 0 refills | Status: AC
  Filled 2020-08-28: qty 120, 30d supply, fill #0

## 2020-07-05 MED ORDER — OXYCODONE-ACETAMINOPHEN 5-325 MG PO TABS
ORAL_TABLET | ORAL | 0 refills | Status: DC
  Filled 2020-07-24: qty 120, 30d supply, fill #0

## 2020-07-06 ENCOUNTER — Other Ambulatory Visit: Payer: Self-pay

## 2020-07-18 DIAGNOSIS — Z6831 Body mass index (BMI) 31.0-31.9, adult: Secondary | ICD-10-CM | POA: Diagnosis not present

## 2020-07-18 DIAGNOSIS — M5136 Other intervertebral disc degeneration, lumbar region: Secondary | ICD-10-CM | POA: Diagnosis not present

## 2020-07-24 ENCOUNTER — Other Ambulatory Visit: Payer: Self-pay

## 2020-07-24 MED ORDER — METFORMIN HCL 1000 MG PO TABS
ORAL_TABLET | ORAL | 3 refills | Status: DC
  Filled 2020-07-24: qty 60, 30d supply, fill #0
  Filled 2020-08-28: qty 60, 30d supply, fill #1
  Filled 2020-10-01: qty 60, 30d supply, fill #2
  Filled 2020-11-06: qty 60, 30d supply, fill #3

## 2020-07-24 MED FILL — Dapagliflozin Propanediol Tab 10 MG (Base Equivalent): ORAL | 90 days supply | Qty: 90 | Fill #0 | Status: AC

## 2020-07-25 ENCOUNTER — Other Ambulatory Visit: Payer: Self-pay

## 2020-08-08 ENCOUNTER — Other Ambulatory Visit: Payer: Self-pay

## 2020-08-09 ENCOUNTER — Other Ambulatory Visit: Payer: Self-pay

## 2020-08-09 MED ORDER — RYBELSUS 14 MG PO TABS
ORAL_TABLET | ORAL | 1 refills | Status: DC
  Filled 2020-08-09: qty 90, 90d supply, fill #0
  Filled 2020-12-11: qty 90, 90d supply, fill #1

## 2020-08-10 ENCOUNTER — Other Ambulatory Visit (HOSPITAL_COMMUNITY): Payer: Self-pay

## 2020-08-10 ENCOUNTER — Other Ambulatory Visit: Payer: Self-pay

## 2020-08-13 ENCOUNTER — Other Ambulatory Visit: Payer: Self-pay

## 2020-08-14 DIAGNOSIS — Z794 Long term (current) use of insulin: Secondary | ICD-10-CM | POA: Diagnosis not present

## 2020-08-14 DIAGNOSIS — E1129 Type 2 diabetes mellitus with other diabetic kidney complication: Secondary | ICD-10-CM | POA: Diagnosis not present

## 2020-08-14 DIAGNOSIS — E538 Deficiency of other specified B group vitamins: Secondary | ICD-10-CM | POA: Diagnosis not present

## 2020-08-14 DIAGNOSIS — E781 Pure hyperglyceridemia: Secondary | ICD-10-CM | POA: Diagnosis not present

## 2020-08-14 DIAGNOSIS — R809 Proteinuria, unspecified: Secondary | ICD-10-CM | POA: Diagnosis not present

## 2020-08-21 DIAGNOSIS — E781 Pure hyperglyceridemia: Secondary | ICD-10-CM | POA: Diagnosis not present

## 2020-08-21 DIAGNOSIS — E1129 Type 2 diabetes mellitus with other diabetic kidney complication: Secondary | ICD-10-CM | POA: Diagnosis not present

## 2020-08-21 DIAGNOSIS — R809 Proteinuria, unspecified: Secondary | ICD-10-CM | POA: Diagnosis not present

## 2020-08-21 DIAGNOSIS — E1165 Type 2 diabetes mellitus with hyperglycemia: Secondary | ICD-10-CM | POA: Diagnosis not present

## 2020-08-28 ENCOUNTER — Other Ambulatory Visit: Payer: Self-pay

## 2020-08-28 DIAGNOSIS — E781 Pure hyperglyceridemia: Secondary | ICD-10-CM | POA: Diagnosis not present

## 2020-08-28 DIAGNOSIS — E1165 Type 2 diabetes mellitus with hyperglycemia: Secondary | ICD-10-CM | POA: Diagnosis not present

## 2020-08-28 DIAGNOSIS — I10 Essential (primary) hypertension: Secondary | ICD-10-CM | POA: Diagnosis not present

## 2020-08-28 DIAGNOSIS — E1129 Type 2 diabetes mellitus with other diabetic kidney complication: Secondary | ICD-10-CM | POA: Diagnosis not present

## 2020-08-28 MED ORDER — OXYCODONE-ACETAMINOPHEN 5-325 MG PO TABS
ORAL_TABLET | ORAL | 0 refills | Status: AC
  Filled 2020-08-28: qty 120, 30d supply, fill #0

## 2020-09-19 ENCOUNTER — Other Ambulatory Visit: Payer: Self-pay

## 2020-09-19 DIAGNOSIS — M5416 Radiculopathy, lumbar region: Secondary | ICD-10-CM | POA: Diagnosis not present

## 2020-09-19 DIAGNOSIS — M961 Postlaminectomy syndrome, not elsewhere classified: Secondary | ICD-10-CM | POA: Diagnosis not present

## 2020-09-19 DIAGNOSIS — F1120 Opioid dependence, uncomplicated: Secondary | ICD-10-CM | POA: Diagnosis not present

## 2020-09-19 MED ORDER — OXYCODONE-ACETAMINOPHEN 5-325 MG PO TABS
ORAL_TABLET | ORAL | 0 refills | Status: AC
  Filled 2020-10-01: qty 120, 30d supply, fill #0

## 2020-09-19 MED ORDER — OXYCODONE-ACETAMINOPHEN 5-325 MG PO TABS
ORAL_TABLET | ORAL | 0 refills | Status: AC
  Filled 2020-11-06: qty 120, 30d supply, fill #0

## 2020-10-01 ENCOUNTER — Other Ambulatory Visit: Payer: Self-pay

## 2020-10-01 MED FILL — Fenofibrate Tab 160 MG: ORAL | 90 days supply | Qty: 90 | Fill #1 | Status: AC

## 2020-10-01 MED FILL — Rosuvastatin Calcium Tab 40 MG: ORAL | 90 days supply | Qty: 90 | Fill #1 | Status: AC

## 2020-11-06 ENCOUNTER — Other Ambulatory Visit: Payer: Self-pay

## 2020-11-06 MED FILL — Dapagliflozin Propanediol Tab 10 MG (Base Equivalent): ORAL | 90 days supply | Qty: 90 | Fill #1 | Status: AC

## 2020-11-15 ENCOUNTER — Other Ambulatory Visit: Payer: Self-pay

## 2020-11-15 DIAGNOSIS — F1120 Opioid dependence, uncomplicated: Secondary | ICD-10-CM | POA: Diagnosis not present

## 2020-11-15 DIAGNOSIS — M5412 Radiculopathy, cervical region: Secondary | ICD-10-CM | POA: Diagnosis not present

## 2020-11-15 DIAGNOSIS — M961 Postlaminectomy syndrome, not elsewhere classified: Secondary | ICD-10-CM | POA: Diagnosis not present

## 2020-11-15 DIAGNOSIS — I10 Essential (primary) hypertension: Secondary | ICD-10-CM | POA: Diagnosis not present

## 2020-11-15 DIAGNOSIS — M5416 Radiculopathy, lumbar region: Secondary | ICD-10-CM | POA: Diagnosis not present

## 2020-11-15 DIAGNOSIS — Z6831 Body mass index (BMI) 31.0-31.9, adult: Secondary | ICD-10-CM | POA: Diagnosis not present

## 2020-11-15 MED ORDER — OXYCODONE-ACETAMINOPHEN 5-325 MG PO TABS
ORAL_TABLET | ORAL | 0 refills | Status: AC
  Filled 2021-01-10: qty 120, 30d supply, fill #0

## 2020-11-15 MED ORDER — OXYCODONE-ACETAMINOPHEN 5-325 MG PO TABS
ORAL_TABLET | ORAL | 0 refills | Status: AC
  Filled 2020-12-11: qty 120, 30d supply, fill #0

## 2020-11-30 ENCOUNTER — Other Ambulatory Visit: Payer: Self-pay

## 2020-11-30 MED ORDER — AMLODIPINE BESYLATE 5 MG PO TABS
ORAL_TABLET | ORAL | 1 refills | Status: DC
  Filled 2020-11-30: qty 90, 90d supply, fill #0
  Filled 2021-03-12: qty 90, 90d supply, fill #1

## 2020-12-03 ENCOUNTER — Other Ambulatory Visit: Payer: Self-pay

## 2020-12-03 MED ORDER — METFORMIN HCL 1000 MG PO TABS
ORAL_TABLET | ORAL | 9 refills | Status: AC
  Filled 2020-12-03: qty 60, 30d supply, fill #0
  Filled 2021-01-10: qty 60, 30d supply, fill #1
  Filled 2021-02-14: qty 60, 30d supply, fill #2
  Filled 2021-03-15: qty 60, 30d supply, fill #3
  Filled 2021-04-24: qty 60, 30d supply, fill #4
  Filled 2021-05-23: qty 60, 30d supply, fill #5
  Filled 2021-07-08: qty 60, 30d supply, fill #6
  Filled 2021-08-13: qty 60, 30d supply, fill #7

## 2020-12-04 ENCOUNTER — Other Ambulatory Visit: Payer: Self-pay

## 2020-12-04 MED ORDER — LISINOPRIL 40 MG PO TABS
ORAL_TABLET | ORAL | 0 refills | Status: AC
  Filled 2020-12-04: qty 90, 90d supply, fill #0

## 2020-12-04 MED ORDER — LISINOPRIL 40 MG PO TABS
ORAL_TABLET | ORAL | 1 refills | Status: DC
  Filled 2021-03-12: qty 90, 90d supply, fill #0
  Filled 2021-06-12: qty 90, 90d supply, fill #1

## 2020-12-11 ENCOUNTER — Other Ambulatory Visit: Payer: Self-pay

## 2020-12-26 DIAGNOSIS — E119 Type 2 diabetes mellitus without complications: Secondary | ICD-10-CM | POA: Diagnosis not present

## 2021-01-04 ENCOUNTER — Other Ambulatory Visit: Payer: Self-pay

## 2021-01-04 MED ORDER — OPTICHAMBER DIAMOND MISC
0 refills | Status: AC
  Filled 2021-01-04: qty 1, 30d supply, fill #0

## 2021-01-04 MED ORDER — PREDNISONE 10 MG PO TABS
ORAL_TABLET | ORAL | 0 refills | Status: AC
  Filled 2021-01-04: qty 21, 6d supply, fill #0

## 2021-01-04 MED ORDER — ALBUTEROL SULFATE HFA 108 (90 BASE) MCG/ACT IN AERS
INHALATION_SPRAY | RESPIRATORY_TRACT | 1 refills | Status: AC
  Filled 2021-01-04: qty 18, 16d supply, fill #0

## 2021-01-04 MED ORDER — PROMETHAZINE-DM 6.25-15 MG/5ML PO SYRP
ORAL_SOLUTION | ORAL | 0 refills | Status: AC
  Filled 2021-01-04: qty 100, 4d supply, fill #0

## 2021-01-09 ENCOUNTER — Other Ambulatory Visit: Payer: Self-pay

## 2021-01-09 MED ORDER — AZITHROMYCIN 250 MG PO TABS
ORAL_TABLET | ORAL | 0 refills | Status: AC
  Filled 2021-01-09: qty 6, 5d supply, fill #0

## 2021-01-10 ENCOUNTER — Other Ambulatory Visit: Payer: Self-pay

## 2021-01-22 ENCOUNTER — Other Ambulatory Visit: Payer: Self-pay

## 2021-01-22 DIAGNOSIS — M961 Postlaminectomy syndrome, not elsewhere classified: Secondary | ICD-10-CM | POA: Diagnosis not present

## 2021-01-22 DIAGNOSIS — M5416 Radiculopathy, lumbar region: Secondary | ICD-10-CM | POA: Diagnosis not present

## 2021-01-22 DIAGNOSIS — F1120 Opioid dependence, uncomplicated: Secondary | ICD-10-CM | POA: Diagnosis not present

## 2021-01-22 MED ORDER — OXYCODONE-ACETAMINOPHEN 5-325 MG PO TABS
ORAL_TABLET | ORAL | 0 refills | Status: AC
  Filled 2021-03-15: qty 120, 30d supply, fill #0

## 2021-01-22 MED ORDER — OXYCODONE-ACETAMINOPHEN 5-325 MG PO TABS
ORAL_TABLET | ORAL | 0 refills | Status: AC
  Filled 2021-04-10 – 2021-04-12 (×2): qty 120, 30d supply, fill #0

## 2021-01-22 MED ORDER — GABAPENTIN 300 MG PO CAPS
ORAL_CAPSULE | ORAL | 3 refills | Status: AC
  Filled 2021-01-22: qty 180, 90d supply, fill #0

## 2021-01-22 MED ORDER — OXYCODONE-ACETAMINOPHEN 5-325 MG PO TABS
ORAL_TABLET | ORAL | 0 refills | Status: AC
  Filled 2021-02-14: qty 120, 30d supply, fill #0

## 2021-02-04 ENCOUNTER — Other Ambulatory Visit: Payer: Self-pay

## 2021-02-14 ENCOUNTER — Other Ambulatory Visit: Payer: Self-pay

## 2021-02-15 ENCOUNTER — Other Ambulatory Visit: Payer: Self-pay

## 2021-02-15 MED ORDER — FARXIGA 10 MG PO TABS
10.0000 mg | ORAL_TABLET | Freq: Every morning | ORAL | 2 refills | Status: AC
  Filled 2021-02-15: qty 90, 90d supply, fill #0
  Filled 2021-05-23: qty 90, 90d supply, fill #1
  Filled 2021-08-26: qty 90, 90d supply, fill #2

## 2021-02-21 DIAGNOSIS — E781 Pure hyperglyceridemia: Secondary | ICD-10-CM | POA: Diagnosis not present

## 2021-02-21 DIAGNOSIS — R809 Proteinuria, unspecified: Secondary | ICD-10-CM | POA: Diagnosis not present

## 2021-02-21 DIAGNOSIS — D518 Other vitamin B12 deficiency anemias: Secondary | ICD-10-CM | POA: Diagnosis not present

## 2021-02-21 DIAGNOSIS — Z794 Long term (current) use of insulin: Secondary | ICD-10-CM | POA: Diagnosis not present

## 2021-02-21 DIAGNOSIS — Z125 Encounter for screening for malignant neoplasm of prostate: Secondary | ICD-10-CM | POA: Diagnosis not present

## 2021-02-21 DIAGNOSIS — E538 Deficiency of other specified B group vitamins: Secondary | ICD-10-CM | POA: Diagnosis not present

## 2021-02-21 DIAGNOSIS — E1129 Type 2 diabetes mellitus with other diabetic kidney complication: Secondary | ICD-10-CM | POA: Diagnosis not present

## 2021-02-28 DIAGNOSIS — E1129 Type 2 diabetes mellitus with other diabetic kidney complication: Secondary | ICD-10-CM | POA: Diagnosis not present

## 2021-02-28 DIAGNOSIS — Z23 Encounter for immunization: Secondary | ICD-10-CM | POA: Diagnosis not present

## 2021-02-28 DIAGNOSIS — E1165 Type 2 diabetes mellitus with hyperglycemia: Secondary | ICD-10-CM | POA: Diagnosis not present

## 2021-02-28 DIAGNOSIS — I10 Essential (primary) hypertension: Secondary | ICD-10-CM | POA: Diagnosis not present

## 2021-02-28 DIAGNOSIS — Z0000 Encounter for general adult medical examination without abnormal findings: Secondary | ICD-10-CM | POA: Diagnosis not present

## 2021-03-05 DIAGNOSIS — Z794 Long term (current) use of insulin: Secondary | ICD-10-CM | POA: Diagnosis not present

## 2021-03-05 DIAGNOSIS — E781 Pure hyperglyceridemia: Secondary | ICD-10-CM | POA: Diagnosis not present

## 2021-03-05 DIAGNOSIS — R809 Proteinuria, unspecified: Secondary | ICD-10-CM | POA: Diagnosis not present

## 2021-03-05 DIAGNOSIS — E1129 Type 2 diabetes mellitus with other diabetic kidney complication: Secondary | ICD-10-CM | POA: Diagnosis not present

## 2021-03-12 ENCOUNTER — Other Ambulatory Visit: Payer: Self-pay

## 2021-03-12 MED ORDER — ROSUVASTATIN CALCIUM 40 MG PO TABS
ORAL_TABLET | Freq: Every day | ORAL | 3 refills | Status: DC
  Filled 2021-03-12: qty 90, 90d supply, fill #0
  Filled 2021-10-10: qty 90, 90d supply, fill #1

## 2021-03-15 ENCOUNTER — Other Ambulatory Visit: Payer: Self-pay

## 2021-03-20 DIAGNOSIS — I10 Essential (primary) hypertension: Secondary | ICD-10-CM | POA: Diagnosis not present

## 2021-03-20 DIAGNOSIS — M5416 Radiculopathy, lumbar region: Secondary | ICD-10-CM | POA: Diagnosis not present

## 2021-03-20 DIAGNOSIS — Z6831 Body mass index (BMI) 31.0-31.9, adult: Secondary | ICD-10-CM | POA: Diagnosis not present

## 2021-04-10 ENCOUNTER — Other Ambulatory Visit: Payer: Self-pay

## 2021-04-10 DIAGNOSIS — F1120 Opioid dependence, uncomplicated: Secondary | ICD-10-CM | POA: Diagnosis not present

## 2021-04-10 DIAGNOSIS — M961 Postlaminectomy syndrome, not elsewhere classified: Secondary | ICD-10-CM | POA: Diagnosis not present

## 2021-04-10 MED ORDER — OXYCODONE-ACETAMINOPHEN 5-325 MG PO TABS
ORAL_TABLET | ORAL | 0 refills | Status: AC
  Filled 2021-05-14: qty 120, 30d supply, fill #0

## 2021-04-10 MED ORDER — OXYCODONE-ACETAMINOPHEN 5-325 MG PO TABS
ORAL_TABLET | ORAL | 0 refills | Status: DC
  Filled 2021-06-12: qty 120, 30d supply, fill #0

## 2021-04-11 ENCOUNTER — Other Ambulatory Visit: Payer: Self-pay

## 2021-04-11 MED ORDER — SEMAGLUTIDE 14 MG PO TABS
ORAL_TABLET | ORAL | 1 refills | Status: DC
Start: 2021-04-11 — End: 2021-08-26
  Filled 2021-04-11: qty 90, 90d supply, fill #0
  Filled 2021-07-15: qty 90, 90d supply, fill #1

## 2021-04-12 ENCOUNTER — Other Ambulatory Visit: Payer: Self-pay

## 2021-04-24 ENCOUNTER — Other Ambulatory Visit: Payer: Self-pay

## 2021-05-10 ENCOUNTER — Other Ambulatory Visit: Payer: Self-pay

## 2021-05-14 ENCOUNTER — Other Ambulatory Visit: Payer: Self-pay

## 2021-05-23 ENCOUNTER — Other Ambulatory Visit: Payer: Self-pay

## 2021-06-12 ENCOUNTER — Other Ambulatory Visit: Payer: Self-pay

## 2021-06-12 MED ORDER — AMLODIPINE BESYLATE 5 MG PO TABS
ORAL_TABLET | ORAL | 1 refills | Status: DC
  Filled 2021-06-12: qty 90, 90d supply, fill #0
  Filled 2021-09-13: qty 90, 90d supply, fill #1

## 2021-07-08 ENCOUNTER — Other Ambulatory Visit: Payer: Self-pay

## 2021-07-08 DIAGNOSIS — M5416 Radiculopathy, lumbar region: Secondary | ICD-10-CM | POA: Diagnosis not present

## 2021-07-08 DIAGNOSIS — M961 Postlaminectomy syndrome, not elsewhere classified: Secondary | ICD-10-CM | POA: Diagnosis not present

## 2021-07-08 DIAGNOSIS — F1120 Opioid dependence, uncomplicated: Secondary | ICD-10-CM | POA: Diagnosis not present

## 2021-07-08 MED ORDER — OXYCODONE-ACETAMINOPHEN 5-325 MG PO TABS
ORAL_TABLET | ORAL | 0 refills | Status: AC
  Filled 2021-08-13: qty 120, 30d supply, fill #0

## 2021-07-08 MED ORDER — OXYCODONE-ACETAMINOPHEN 5-325 MG PO TABS
ORAL_TABLET | ORAL | 0 refills | Status: DC
  Filled 2021-09-13: qty 120, 30d supply, fill #0

## 2021-07-08 MED ORDER — OXYCODONE-ACETAMINOPHEN 5-325 MG PO TABS
ORAL_TABLET | ORAL | 0 refills | Status: AC
  Filled 2021-07-15: qty 120, 30d supply, fill #0

## 2021-07-15 ENCOUNTER — Other Ambulatory Visit: Payer: Self-pay

## 2021-07-17 DIAGNOSIS — Z6830 Body mass index (BMI) 30.0-30.9, adult: Secondary | ICD-10-CM | POA: Diagnosis not present

## 2021-07-17 DIAGNOSIS — M5416 Radiculopathy, lumbar region: Secondary | ICD-10-CM | POA: Diagnosis not present

## 2021-08-13 ENCOUNTER — Other Ambulatory Visit: Payer: Self-pay

## 2021-08-21 DIAGNOSIS — E538 Deficiency of other specified B group vitamins: Secondary | ICD-10-CM | POA: Diagnosis not present

## 2021-08-21 DIAGNOSIS — E1129 Type 2 diabetes mellitus with other diabetic kidney complication: Secondary | ICD-10-CM | POA: Diagnosis not present

## 2021-08-21 DIAGNOSIS — E781 Pure hyperglyceridemia: Secondary | ICD-10-CM | POA: Diagnosis not present

## 2021-08-21 DIAGNOSIS — Z794 Long term (current) use of insulin: Secondary | ICD-10-CM | POA: Diagnosis not present

## 2021-08-21 DIAGNOSIS — R809 Proteinuria, unspecified: Secondary | ICD-10-CM | POA: Diagnosis not present

## 2021-08-26 ENCOUNTER — Other Ambulatory Visit: Payer: Self-pay

## 2021-08-26 DIAGNOSIS — R809 Proteinuria, unspecified: Secondary | ICD-10-CM | POA: Diagnosis not present

## 2021-08-26 DIAGNOSIS — Z794 Long term (current) use of insulin: Secondary | ICD-10-CM | POA: Diagnosis not present

## 2021-08-26 DIAGNOSIS — E1129 Type 2 diabetes mellitus with other diabetic kidney complication: Secondary | ICD-10-CM | POA: Diagnosis not present

## 2021-08-26 DIAGNOSIS — E781 Pure hyperglyceridemia: Secondary | ICD-10-CM | POA: Diagnosis not present

## 2021-08-26 MED ORDER — RYBELSUS 14 MG PO TABS
ORAL_TABLET | ORAL | 3 refills | Status: DC
  Filled 2021-08-26 – 2021-10-14 (×2): qty 90, 90d supply, fill #0
  Filled 2022-01-13: qty 90, 90d supply, fill #1
  Filled 2022-04-25: qty 90, 90d supply, fill #2
  Filled 2022-04-28: qty 30, 30d supply, fill #2

## 2021-08-26 MED ORDER — METFORMIN HCL 1000 MG PO TABS
ORAL_TABLET | ORAL | 3 refills | Status: AC
Start: 2021-08-26 — End: ?
  Filled 2021-08-26 – 2021-09-19 (×2): qty 180, 90d supply, fill #0
  Filled 2021-12-24: qty 180, 90d supply, fill #1
  Filled 2022-03-31: qty 180, 90d supply, fill #2

## 2021-08-26 MED ORDER — INSULIN LISPRO PROT & LISPRO (75-25 MIX) 100 UNIT/ML KWIKPEN
PEN_INJECTOR | SUBCUTANEOUS | 5 refills | Status: AC
  Filled 2021-08-26: qty 45, 42d supply, fill #0
  Filled 2021-10-01: qty 30, 28d supply, fill #0
  Filled 2021-12-13: qty 30, 28d supply, fill #1

## 2021-08-26 MED ORDER — FARXIGA 10 MG PO TABS
ORAL_TABLET | ORAL | 3 refills | Status: AC
  Filled 2021-08-26 – 2021-11-25 (×2): qty 90, 90d supply, fill #0
  Filled 2022-02-25: qty 90, 90d supply, fill #1
  Filled 2022-05-26: qty 90, 90d supply, fill #2

## 2021-08-28 ENCOUNTER — Other Ambulatory Visit: Payer: Self-pay

## 2021-08-28 DIAGNOSIS — E538 Deficiency of other specified B group vitamins: Secondary | ICD-10-CM | POA: Diagnosis not present

## 2021-08-28 DIAGNOSIS — E1129 Type 2 diabetes mellitus with other diabetic kidney complication: Secondary | ICD-10-CM | POA: Diagnosis not present

## 2021-08-28 DIAGNOSIS — D518 Other vitamin B12 deficiency anemias: Secondary | ICD-10-CM | POA: Diagnosis not present

## 2021-08-28 DIAGNOSIS — E1165 Type 2 diabetes mellitus with hyperglycemia: Secondary | ICD-10-CM | POA: Diagnosis not present

## 2021-08-28 DIAGNOSIS — I10 Essential (primary) hypertension: Secondary | ICD-10-CM | POA: Diagnosis not present

## 2021-08-28 DIAGNOSIS — R809 Proteinuria, unspecified: Secondary | ICD-10-CM | POA: Diagnosis not present

## 2021-08-28 DIAGNOSIS — M48062 Spinal stenosis, lumbar region with neurogenic claudication: Secondary | ICD-10-CM | POA: Diagnosis not present

## 2021-08-28 DIAGNOSIS — E781 Pure hyperglyceridemia: Secondary | ICD-10-CM | POA: Diagnosis not present

## 2021-08-28 DIAGNOSIS — Z125 Encounter for screening for malignant neoplasm of prostate: Secondary | ICD-10-CM | POA: Diagnosis not present

## 2021-08-28 MED ORDER — OMEGA-3-ACID ETHYL ESTERS 1 G PO CAPS
ORAL_CAPSULE | ORAL | 4 refills | Status: AC
  Filled 2021-08-28: qty 450, 90d supply, fill #0

## 2021-08-29 ENCOUNTER — Other Ambulatory Visit: Payer: Self-pay

## 2021-08-29 MED ORDER — FENOFIBRATE 160 MG PO TABS
160.0000 mg | ORAL_TABLET | Freq: Every day | ORAL | 3 refills | Status: AC
  Filled 2021-08-29: qty 90, 90d supply, fill #0
  Filled 2021-11-25: qty 90, 90d supply, fill #1
  Filled 2022-02-25: qty 90, 90d supply, fill #2
  Filled 2022-05-29: qty 90, 90d supply, fill #3

## 2021-09-03 ENCOUNTER — Other Ambulatory Visit: Payer: Self-pay

## 2021-09-10 ENCOUNTER — Other Ambulatory Visit: Payer: Self-pay

## 2021-09-10 MED ORDER — PREDNISONE 10 MG PO TABS
ORAL_TABLET | ORAL | 0 refills | Status: DC
  Filled 2021-09-10: qty 20, 8d supply, fill #0

## 2021-09-13 ENCOUNTER — Other Ambulatory Visit: Payer: Self-pay

## 2021-09-13 MED ORDER — LISINOPRIL 40 MG PO TABS
40.0000 mg | ORAL_TABLET | Freq: Every day | ORAL | 1 refills | Status: DC
  Filled 2021-09-13: qty 90, 90d supply, fill #0
  Filled 2021-12-13: qty 90, 90d supply, fill #1

## 2021-09-17 ENCOUNTER — Other Ambulatory Visit: Payer: Self-pay

## 2021-09-17 DIAGNOSIS — M5416 Radiculopathy, lumbar region: Secondary | ICD-10-CM | POA: Diagnosis not present

## 2021-09-17 DIAGNOSIS — F1120 Opioid dependence, uncomplicated: Secondary | ICD-10-CM | POA: Diagnosis not present

## 2021-09-17 DIAGNOSIS — M961 Postlaminectomy syndrome, not elsewhere classified: Secondary | ICD-10-CM | POA: Diagnosis not present

## 2021-09-17 MED ORDER — OXYCODONE-ACETAMINOPHEN 5-325 MG PO TABS
ORAL_TABLET | ORAL | 0 refills | Status: AC
Start: 2021-09-17 — End: ?
  Filled 2021-11-12: qty 120, 30d supply, fill #0

## 2021-09-17 MED ORDER — OXYCODONE-ACETAMINOPHEN 5-325 MG PO TABS
ORAL_TABLET | ORAL | 0 refills | Status: AC
Start: 2021-09-17 — End: ?
  Filled 2021-12-13: qty 120, 30d supply, fill #0

## 2021-09-17 MED ORDER — OXYCODONE-ACETAMINOPHEN 5-325 MG PO TABS
ORAL_TABLET | ORAL | 0 refills | Status: AC
Start: 2021-09-17 — End: ?
  Filled 2021-10-10: qty 120, fill #0
  Filled 2021-10-14: qty 120, 30d supply, fill #0

## 2021-09-19 ENCOUNTER — Other Ambulatory Visit: Payer: Self-pay

## 2021-09-23 ENCOUNTER — Other Ambulatory Visit: Payer: Self-pay

## 2021-10-01 ENCOUNTER — Other Ambulatory Visit: Payer: Self-pay

## 2021-10-10 ENCOUNTER — Other Ambulatory Visit: Payer: Self-pay

## 2021-10-14 ENCOUNTER — Other Ambulatory Visit: Payer: Self-pay

## 2021-11-12 ENCOUNTER — Other Ambulatory Visit: Payer: Self-pay

## 2021-11-13 DIAGNOSIS — M5416 Radiculopathy, lumbar region: Secondary | ICD-10-CM | POA: Diagnosis not present

## 2021-11-13 DIAGNOSIS — Z6831 Body mass index (BMI) 31.0-31.9, adult: Secondary | ICD-10-CM | POA: Diagnosis not present

## 2021-11-25 ENCOUNTER — Other Ambulatory Visit: Payer: Self-pay

## 2021-12-09 DIAGNOSIS — Z6831 Body mass index (BMI) 31.0-31.9, adult: Secondary | ICD-10-CM | POA: Diagnosis not present

## 2021-12-09 DIAGNOSIS — F1120 Opioid dependence, uncomplicated: Secondary | ICD-10-CM | POA: Diagnosis not present

## 2021-12-09 DIAGNOSIS — M961 Postlaminectomy syndrome, not elsewhere classified: Secondary | ICD-10-CM | POA: Diagnosis not present

## 2021-12-09 DIAGNOSIS — M5416 Radiculopathy, lumbar region: Secondary | ICD-10-CM | POA: Diagnosis not present

## 2021-12-13 ENCOUNTER — Other Ambulatory Visit: Payer: Self-pay

## 2021-12-13 MED ORDER — AMLODIPINE BESYLATE 5 MG PO TABS
5.0000 mg | ORAL_TABLET | Freq: Every day | ORAL | 1 refills | Status: DC
  Filled 2021-12-13: qty 90, 90d supply, fill #0
  Filled 2022-03-20: qty 90, 90d supply, fill #1

## 2021-12-19 ENCOUNTER — Other Ambulatory Visit (HOSPITAL_COMMUNITY): Payer: Self-pay

## 2021-12-24 ENCOUNTER — Other Ambulatory Visit: Payer: Self-pay

## 2021-12-24 DIAGNOSIS — E1129 Type 2 diabetes mellitus with other diabetic kidney complication: Secondary | ICD-10-CM | POA: Diagnosis not present

## 2021-12-24 DIAGNOSIS — R809 Proteinuria, unspecified: Secondary | ICD-10-CM | POA: Diagnosis not present

## 2021-12-24 DIAGNOSIS — Z794 Long term (current) use of insulin: Secondary | ICD-10-CM | POA: Diagnosis not present

## 2021-12-31 ENCOUNTER — Other Ambulatory Visit: Payer: Self-pay

## 2021-12-31 DIAGNOSIS — E1129 Type 2 diabetes mellitus with other diabetic kidney complication: Secondary | ICD-10-CM | POA: Diagnosis not present

## 2021-12-31 DIAGNOSIS — Z794 Long term (current) use of insulin: Secondary | ICD-10-CM | POA: Diagnosis not present

## 2021-12-31 DIAGNOSIS — R809 Proteinuria, unspecified: Secondary | ICD-10-CM | POA: Diagnosis not present

## 2021-12-31 MED ORDER — INSULIN LISPRO PROT & LISPRO (75-25 MIX) 100 UNIT/ML KWIKPEN
PEN_INJECTOR | SUBCUTANEOUS | 5 refills | Status: AC
  Filled 2021-12-31: qty 90, 84d supply, fill #0

## 2021-12-31 MED ORDER — FREESTYLE LIBRE 3 SENSOR MISC
3 refills | Status: DC
  Filled 2021-12-31: qty 6, 84d supply, fill #0

## 2022-01-01 ENCOUNTER — Other Ambulatory Visit: Payer: Self-pay

## 2022-01-03 ENCOUNTER — Other Ambulatory Visit: Payer: Self-pay

## 2022-01-03 MED ORDER — FREESTYLE LITE TEST VI STRP
ORAL_STRIP | 3 refills | Status: DC
  Filled 2022-01-03: qty 200, 90d supply, fill #0

## 2022-01-06 ENCOUNTER — Other Ambulatory Visit: Payer: Self-pay

## 2022-01-09 ENCOUNTER — Other Ambulatory Visit: Payer: Self-pay

## 2022-01-13 ENCOUNTER — Other Ambulatory Visit: Payer: Self-pay

## 2022-01-14 ENCOUNTER — Other Ambulatory Visit: Payer: Self-pay

## 2022-01-15 ENCOUNTER — Other Ambulatory Visit: Payer: Self-pay

## 2022-01-16 ENCOUNTER — Other Ambulatory Visit: Payer: Self-pay

## 2022-01-17 ENCOUNTER — Other Ambulatory Visit: Payer: Self-pay

## 2022-01-19 ENCOUNTER — Other Ambulatory Visit: Payer: Self-pay

## 2022-01-20 ENCOUNTER — Other Ambulatory Visit: Payer: Self-pay

## 2022-01-20 MED ORDER — OXYCODONE-ACETAMINOPHEN 5-325 MG PO TABS
1.0000 | ORAL_TABLET | Freq: Four times a day (QID) | ORAL | 0 refills | Status: AC | PRN
  Filled 2022-01-20: qty 120, 30d supply, fill #0

## 2022-01-20 MED ORDER — OXYCODONE-ACETAMINOPHEN 5-325 MG PO TABS
1.0000 | ORAL_TABLET | Freq: Four times a day (QID) | ORAL | 0 refills | Status: DC | PRN
  Filled 2022-02-25: qty 120, 30d supply, fill #0

## 2022-01-23 ENCOUNTER — Other Ambulatory Visit: Payer: Self-pay

## 2022-01-23 ENCOUNTER — Other Ambulatory Visit (HOSPITAL_COMMUNITY): Payer: Self-pay

## 2022-01-31 ENCOUNTER — Other Ambulatory Visit: Payer: Self-pay

## 2022-01-31 MED ORDER — AZITHROMYCIN 250 MG PO TABS
ORAL_TABLET | ORAL | 0 refills | Status: AC
  Filled 2022-01-31: qty 6, 5d supply, fill #0

## 2022-01-31 MED ORDER — BENZONATATE 200 MG PO CAPS
ORAL_CAPSULE | ORAL | 0 refills | Status: AC
  Filled 2022-01-31: qty 20, 7d supply, fill #0

## 2022-02-06 DIAGNOSIS — E119 Type 2 diabetes mellitus without complications: Secondary | ICD-10-CM | POA: Diagnosis not present

## 2022-02-06 DIAGNOSIS — H524 Presbyopia: Secondary | ICD-10-CM | POA: Diagnosis not present

## 2022-02-25 ENCOUNTER — Other Ambulatory Visit: Payer: Self-pay

## 2022-02-25 DIAGNOSIS — Z125 Encounter for screening for malignant neoplasm of prostate: Secondary | ICD-10-CM | POA: Diagnosis not present

## 2022-02-25 DIAGNOSIS — Z794 Long term (current) use of insulin: Secondary | ICD-10-CM | POA: Diagnosis not present

## 2022-02-25 DIAGNOSIS — R809 Proteinuria, unspecified: Secondary | ICD-10-CM | POA: Diagnosis not present

## 2022-02-25 DIAGNOSIS — E538 Deficiency of other specified B group vitamins: Secondary | ICD-10-CM | POA: Diagnosis not present

## 2022-02-25 DIAGNOSIS — E1129 Type 2 diabetes mellitus with other diabetic kidney complication: Secondary | ICD-10-CM | POA: Diagnosis not present

## 2022-02-25 DIAGNOSIS — E781 Pure hyperglyceridemia: Secondary | ICD-10-CM | POA: Diagnosis not present

## 2022-03-04 DIAGNOSIS — I10 Essential (primary) hypertension: Secondary | ICD-10-CM | POA: Diagnosis not present

## 2022-03-04 DIAGNOSIS — D518 Other vitamin B12 deficiency anemias: Secondary | ICD-10-CM | POA: Diagnosis not present

## 2022-03-04 DIAGNOSIS — Z0000 Encounter for general adult medical examination without abnormal findings: Secondary | ICD-10-CM | POA: Diagnosis not present

## 2022-03-04 DIAGNOSIS — E1129 Type 2 diabetes mellitus with other diabetic kidney complication: Secondary | ICD-10-CM | POA: Diagnosis not present

## 2022-03-04 DIAGNOSIS — E781 Pure hyperglyceridemia: Secondary | ICD-10-CM | POA: Diagnosis not present

## 2022-03-04 DIAGNOSIS — M5136 Other intervertebral disc degeneration, lumbar region: Secondary | ICD-10-CM | POA: Diagnosis not present

## 2022-03-04 DIAGNOSIS — Z2821 Immunization not carried out because of patient refusal: Secondary | ICD-10-CM | POA: Diagnosis not present

## 2022-03-04 DIAGNOSIS — M48062 Spinal stenosis, lumbar region with neurogenic claudication: Secondary | ICD-10-CM | POA: Diagnosis not present

## 2022-03-04 DIAGNOSIS — E538 Deficiency of other specified B group vitamins: Secondary | ICD-10-CM | POA: Diagnosis not present

## 2022-03-04 DIAGNOSIS — E1165 Type 2 diabetes mellitus with hyperglycemia: Secondary | ICD-10-CM | POA: Diagnosis not present

## 2022-03-07 ENCOUNTER — Other Ambulatory Visit: Payer: Self-pay

## 2022-03-07 DIAGNOSIS — R051 Acute cough: Secondary | ICD-10-CM | POA: Diagnosis not present

## 2022-03-07 DIAGNOSIS — Z03818 Encounter for observation for suspected exposure to other biological agents ruled out: Secondary | ICD-10-CM | POA: Diagnosis not present

## 2022-03-07 DIAGNOSIS — J101 Influenza due to other identified influenza virus with other respiratory manifestations: Secondary | ICD-10-CM | POA: Diagnosis not present

## 2022-03-07 MED ORDER — HYDROCOD POLI-CHLORPHE POLI ER 10-8 MG/5ML PO SUER
5.0000 mL | Freq: Two times a day (BID) | ORAL | 0 refills | Status: DC | PRN
  Filled 2022-03-07: qty 115, 12d supply, fill #0

## 2022-03-07 MED ORDER — OSELTAMIVIR PHOSPHATE 75 MG PO CAPS
75.0000 mg | ORAL_CAPSULE | Freq: Two times a day (BID) | ORAL | 0 refills | Status: AC
  Filled 2022-03-07: qty 10, 5d supply, fill #0

## 2022-03-07 MED ORDER — BENZONATATE 200 MG PO CAPS
200.0000 mg | ORAL_CAPSULE | Freq: Three times a day (TID) | ORAL | 1 refills | Status: DC | PRN
  Filled 2022-03-07: qty 30, 10d supply, fill #0

## 2022-03-12 ENCOUNTER — Other Ambulatory Visit: Payer: Self-pay

## 2022-03-12 DIAGNOSIS — Z6830 Body mass index (BMI) 30.0-30.9, adult: Secondary | ICD-10-CM | POA: Diagnosis not present

## 2022-03-12 DIAGNOSIS — F1120 Opioid dependence, uncomplicated: Secondary | ICD-10-CM | POA: Diagnosis not present

## 2022-03-12 DIAGNOSIS — M961 Postlaminectomy syndrome, not elsewhere classified: Secondary | ICD-10-CM | POA: Diagnosis not present

## 2022-03-12 DIAGNOSIS — M5416 Radiculopathy, lumbar region: Secondary | ICD-10-CM | POA: Diagnosis not present

## 2022-03-12 MED ORDER — OXYCODONE-ACETAMINOPHEN 5-325 MG PO TABS
1.0000 | ORAL_TABLET | Freq: Four times a day (QID) | ORAL | 0 refills | Status: AC | PRN
  Filled 2022-03-31: qty 120, 30d supply, fill #0

## 2022-03-12 MED ORDER — OXYCODONE-ACETAMINOPHEN 5-325 MG PO TABS
1.0000 | ORAL_TABLET | Freq: Four times a day (QID) | ORAL | 0 refills | Status: AC | PRN
  Filled 2022-05-02: qty 120, 30d supply, fill #0

## 2022-03-12 MED ORDER — GABAPENTIN 300 MG PO CAPS
300.0000 mg | ORAL_CAPSULE | Freq: Every evening | ORAL | 3 refills | Status: AC
  Filled 2022-03-12: qty 180, 90d supply, fill #0

## 2022-03-12 MED ORDER — OXYCODONE-ACETAMINOPHEN 5-325 MG PO TABS
1.0000 | ORAL_TABLET | ORAL | 0 refills | Status: AC
  Filled 2022-06-02: qty 120, 30d supply, fill #0

## 2022-03-20 ENCOUNTER — Other Ambulatory Visit: Payer: Self-pay

## 2022-03-20 MED ORDER — ROSUVASTATIN CALCIUM 40 MG PO TABS
ORAL_TABLET | Freq: Every day | ORAL | 3 refills | Status: DC
  Filled 2022-03-20: qty 90, 90d supply, fill #0
  Filled 2022-10-29: qty 90, 90d supply, fill #1
  Filled 2023-01-26: qty 90, 90d supply, fill #2

## 2022-03-20 MED ORDER — LISINOPRIL 40 MG PO TABS
40.0000 mg | ORAL_TABLET | Freq: Every day | ORAL | 1 refills | Status: DC
  Filled 2022-03-20: qty 90, 90d supply, fill #0
  Filled 2022-06-19: qty 15, 15d supply, fill #1
  Filled 2022-06-19: qty 75, 75d supply, fill #1

## 2022-03-21 ENCOUNTER — Other Ambulatory Visit: Payer: Self-pay

## 2022-03-31 ENCOUNTER — Other Ambulatory Visit: Payer: Self-pay

## 2022-04-25 ENCOUNTER — Other Ambulatory Visit: Payer: Self-pay

## 2022-04-28 ENCOUNTER — Other Ambulatory Visit: Payer: Self-pay

## 2022-05-02 ENCOUNTER — Other Ambulatory Visit: Payer: Self-pay

## 2022-05-12 ENCOUNTER — Other Ambulatory Visit: Payer: Self-pay

## 2022-05-12 DIAGNOSIS — E781 Pure hyperglyceridemia: Secondary | ICD-10-CM | POA: Diagnosis not present

## 2022-05-12 DIAGNOSIS — Z794 Long term (current) use of insulin: Secondary | ICD-10-CM | POA: Diagnosis not present

## 2022-05-12 DIAGNOSIS — E1129 Type 2 diabetes mellitus with other diabetic kidney complication: Secondary | ICD-10-CM | POA: Diagnosis not present

## 2022-05-12 DIAGNOSIS — I10 Essential (primary) hypertension: Secondary | ICD-10-CM | POA: Diagnosis not present

## 2022-05-12 DIAGNOSIS — R809 Proteinuria, unspecified: Secondary | ICD-10-CM | POA: Diagnosis not present

## 2022-05-12 MED ORDER — DAPAGLIFLOZIN PROPANEDIOL 10 MG PO TABS
10.0000 mg | ORAL_TABLET | Freq: Every morning | ORAL | 3 refills | Status: AC
  Filled 2022-05-12 – 2022-08-29 (×2): qty 90, 90d supply, fill #0
  Filled 2022-12-02: qty 90, 90d supply, fill #1
  Filled 2023-03-09: qty 90, 90d supply, fill #2

## 2022-05-12 MED ORDER — METFORMIN HCL 1000 MG PO TABS
1000.0000 mg | ORAL_TABLET | Freq: Two times a day (BID) | ORAL | 3 refills | Status: DC
  Filled 2022-05-12 – 2022-07-03 (×2): qty 180, 90d supply, fill #0
  Filled 2022-10-15: qty 180, 90d supply, fill #1
  Filled 2023-01-26: qty 180, 90d supply, fill #2
  Filled 2023-04-09: qty 180, 90d supply, fill #3

## 2022-05-12 MED ORDER — RYBELSUS 14 MG PO TABS
14.0000 mg | ORAL_TABLET | Freq: Every day | ORAL | 3 refills | Status: DC
  Filled 2022-05-12 – 2022-05-29 (×2): qty 90, 90d supply, fill #0
  Filled 2022-08-29: qty 90, 90d supply, fill #1

## 2022-05-26 ENCOUNTER — Other Ambulatory Visit: Payer: Self-pay

## 2022-05-29 ENCOUNTER — Other Ambulatory Visit: Payer: Self-pay

## 2022-06-02 ENCOUNTER — Other Ambulatory Visit: Payer: Self-pay

## 2022-06-06 IMAGING — MR MR LUMBAR SPINE W/O CM
4 of 5 series · 25 of 48 positions shown · non-contrast
Comparison: 01/10/2017 and prior.

CLINICAL DATA: Low back pain radiating into left groin/lower
extremity.

EXAM:
MRI LUMBAR SPINE WITHOUT CONTRAST
TECHNIQUE: Multiplanar, multisequence MR imaging of the lumbar spine was
performed. No intravenous contrast was administered.

[Series 4: T2 · sagittal · 4.0mm · 0.57mm/px · 7 of 16 slices shown (1 of 2)]
[im 1/16]
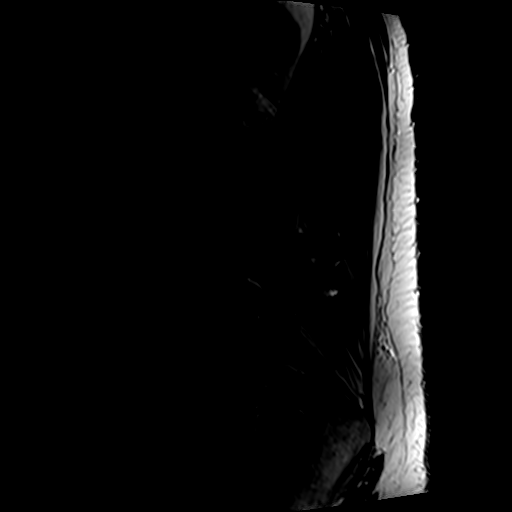
[im 3/16]
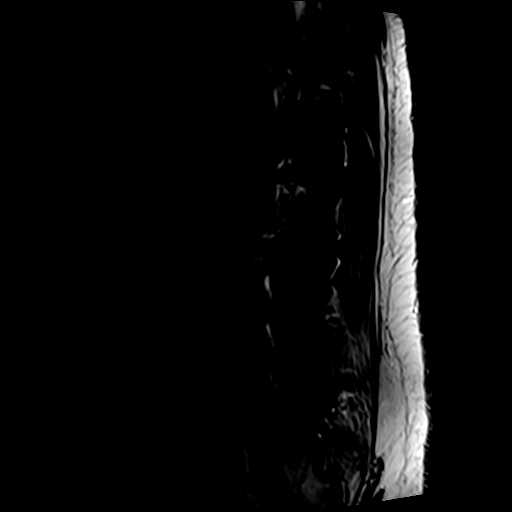
[im 6/16]
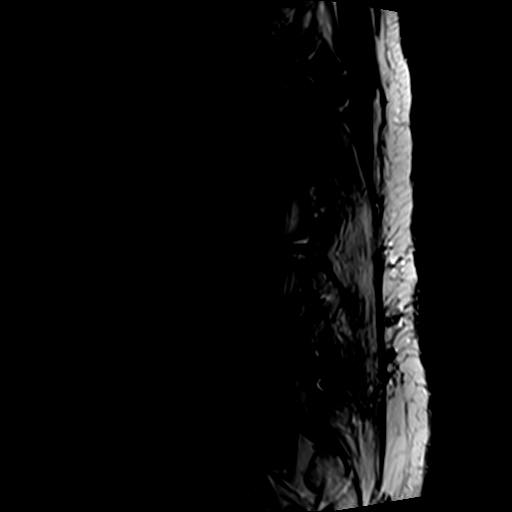
[im 8/16]
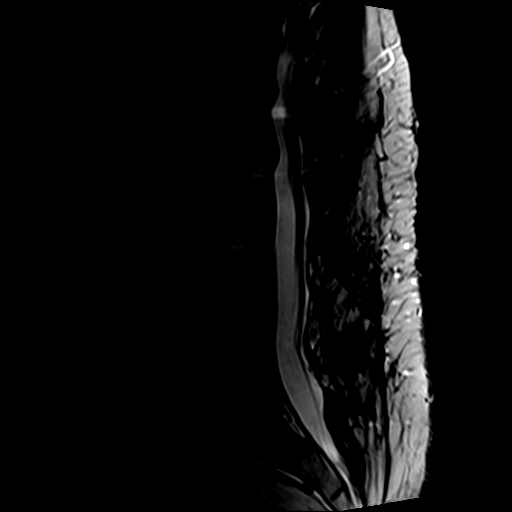
[im 11/16]
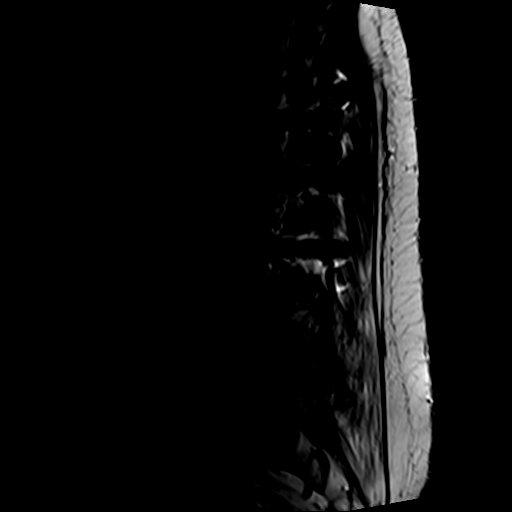
[im 13/16]
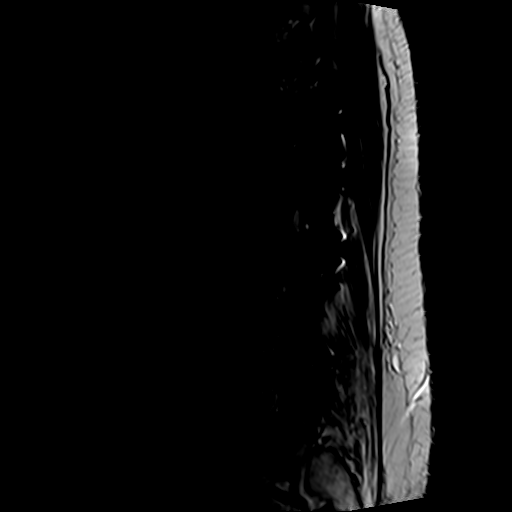
[im 16/16]
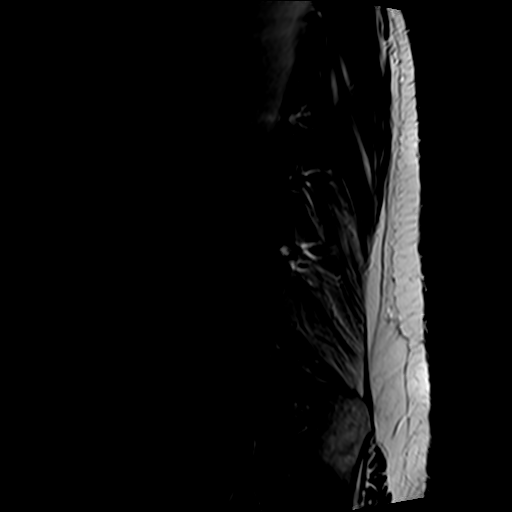

[Series 6: T1 · sagittal · 4.0mm · 0.57mm/px · 6 of 16 slices shown (1 of 2)]
[im 1/16]
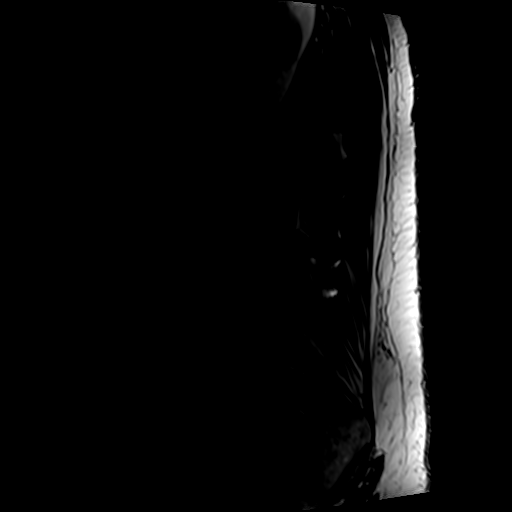
[im 4/16]
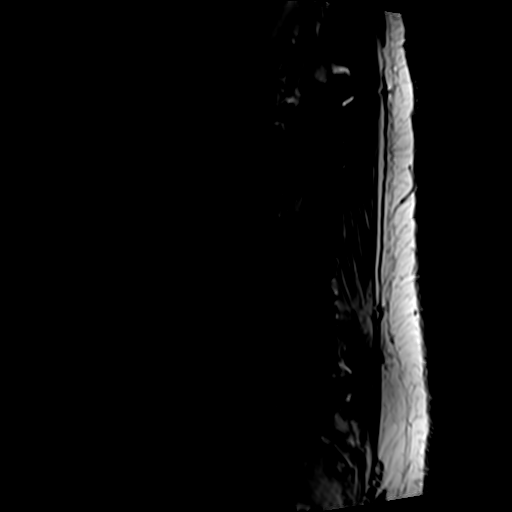
[im 7/16]
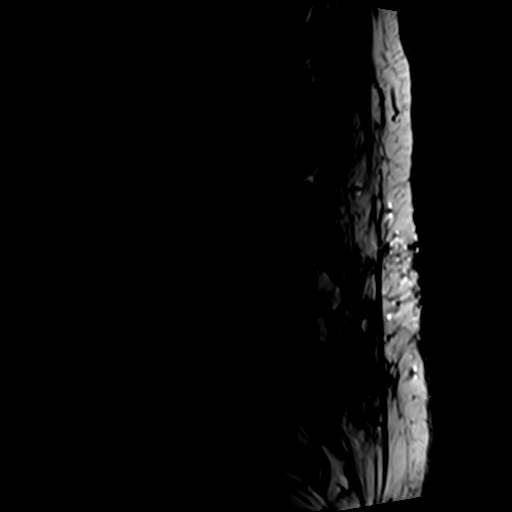
[im 10/16]
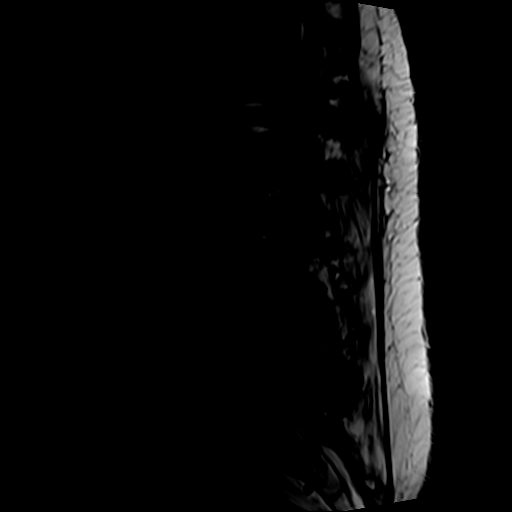
[im 13/16]
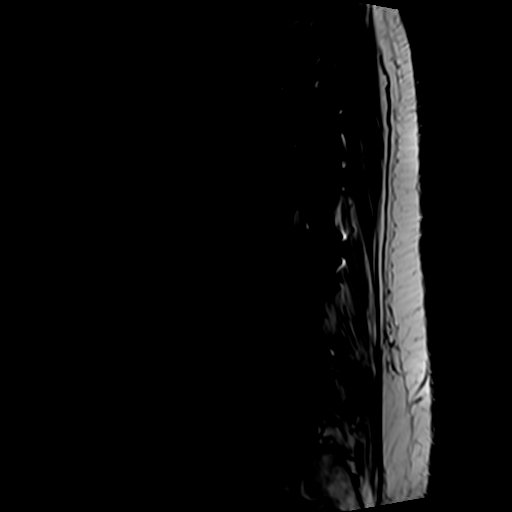
[im 16/16]
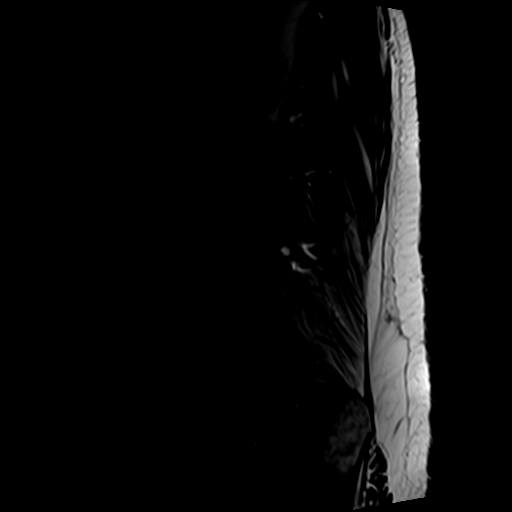

[Series 7: T2 · axial · 4.0mm · 0.70mm/px · z∈[-160,+32]mm · 8 of 36 slices shown (2 of 2)]
[im 1/36]
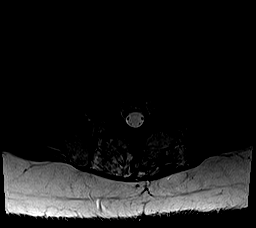
[im 6/36]
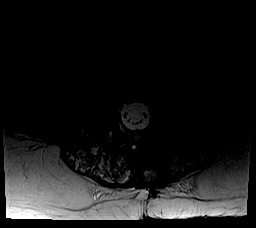
[im 11/36]
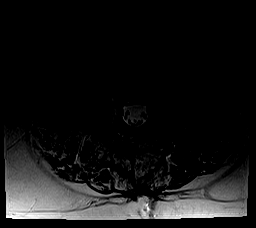
[im 17/36]
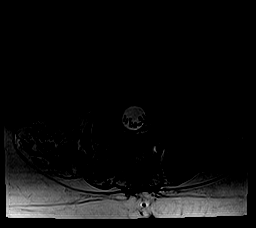
[im 19/36]
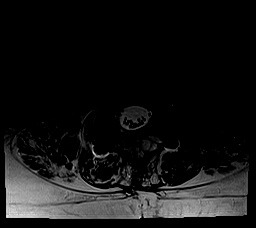
[im 25/36]
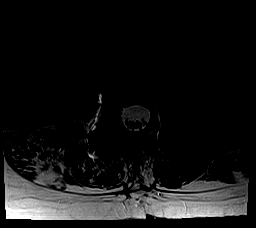
[im 30/36]
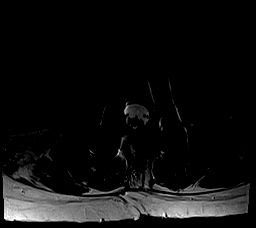
[im 36/36]
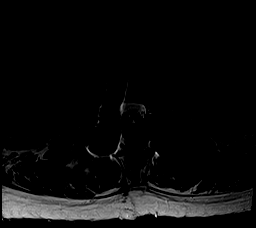

[Series 8: T1 · axial · 4.0mm · 0.35mm/px · z∈[-160,+1]mm · 4 of 36 slices shown (2 of 2)]
[im 1/36]
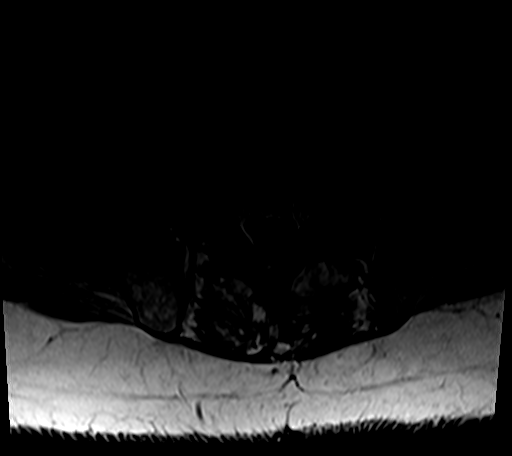
[im 6/36]
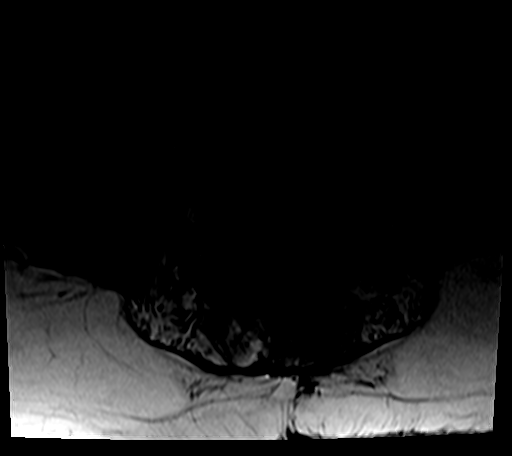
[im 19/36]
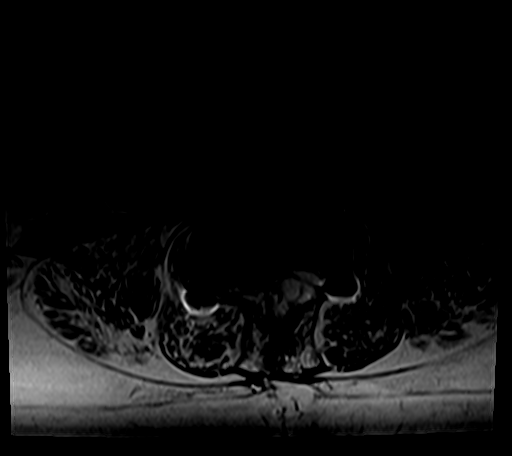
[im 30/36]
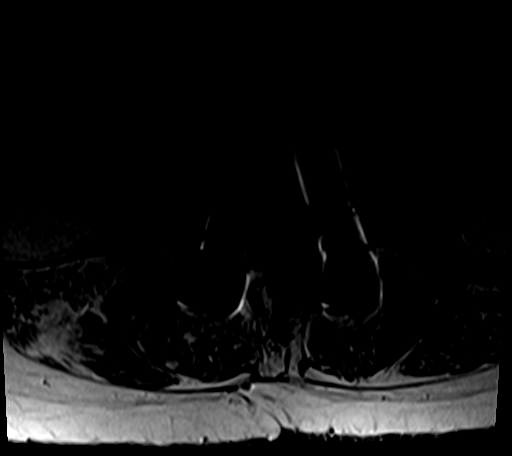

[25 of 48 positions shown; findings below may reference images not displayed]

FINDINGS: Segmentation:  Standard.

Alignment: Straightening of lordosis. Posterior spinal fixation
hardware spanning the L1-3 levels with associated susceptibility
artifact. Sequela of remote posterior decompression and fusion from
L3 to the sacrum.

Vertebrae: Normal bone marrow signal intensity. No aggressive
osseous lesion.

Conus medullaris and cauda equina: Conus extends to the L1 level.
Conus and cauda equina appear normal.

Disc levels: L1-2 desiccation with mild disc space loss. Interbody
spacers at the remaining lumbar levels.

L1-2: Sequela of fusion. Shallow left paracentral protrusion/annular
fissuring. Patent spinal canal and neural foramen.

L2-3: Sequela of fusion.  Patent spinal canal and neural foramen.

L3-4: Sequela of fusion. Shallow left subarticular
protrusion/annular fissuring. Patent spinal canal and right neural
foramen. Mild narrowing of the left subarticular recess.

L4-5: Sequela of fusion.  Patent spinal canal and neural foramen.

L5-S1: Sequela of fusion.  Patent spinal canal and neural foramen.

Paraspinal and other soft tissues: Postsurgical appearance of the
paraspinal soft tissues.
IMPRESSION: Sequela of multilevel fusion and posterior decompression with
hardware spanning the L1-3 levels.

Shallow left L1-2 paracentral protrusion/annular fissuring without
significant stenosis.

Shallow left subarticular protrusion/annular fissuring at the L3-4
level with mild narrowing of the subarticular recess.

## 2022-06-11 ENCOUNTER — Other Ambulatory Visit: Payer: Self-pay

## 2022-06-11 DIAGNOSIS — Z6831 Body mass index (BMI) 31.0-31.9, adult: Secondary | ICD-10-CM | POA: Diagnosis not present

## 2022-06-11 DIAGNOSIS — M5416 Radiculopathy, lumbar region: Secondary | ICD-10-CM | POA: Diagnosis not present

## 2022-06-11 DIAGNOSIS — F1120 Opioid dependence, uncomplicated: Secondary | ICD-10-CM | POA: Diagnosis not present

## 2022-06-11 DIAGNOSIS — M961 Postlaminectomy syndrome, not elsewhere classified: Secondary | ICD-10-CM | POA: Diagnosis not present

## 2022-06-11 MED ORDER — OXYCODONE-ACETAMINOPHEN 5-325 MG PO TABS
1.0000 | ORAL_TABLET | Freq: Four times a day (QID) | ORAL | 0 refills | Status: AC | PRN
  Filled 2022-08-04: qty 120, 30d supply, fill #0

## 2022-06-11 MED ORDER — GABAPENTIN 300 MG PO CAPS
300.0000 mg | ORAL_CAPSULE | Freq: Every evening | ORAL | 3 refills | Status: AC
  Filled 2022-06-11: qty 180, 90d supply, fill #0

## 2022-06-11 MED ORDER — OXYCODONE-ACETAMINOPHEN 5-325 MG PO TABS
1.0000 | ORAL_TABLET | Freq: Four times a day (QID) | ORAL | 0 refills | Status: AC | PRN
  Filled 2022-07-03: qty 120, 30d supply, fill #0

## 2022-06-11 MED ORDER — OXYCODONE-ACETAMINOPHEN 5-325 MG PO TABS
1.0000 | ORAL_TABLET | Freq: Four times a day (QID) | ORAL | 0 refills | Status: AC | PRN
  Filled 2022-09-05: qty 120, 30d supply, fill #0

## 2022-06-19 ENCOUNTER — Other Ambulatory Visit: Payer: Self-pay

## 2022-06-19 MED ORDER — AMLODIPINE BESYLATE 5 MG PO TABS
5.0000 mg | ORAL_TABLET | Freq: Every day | ORAL | 1 refills | Status: DC
  Filled 2022-06-19: qty 90, 90d supply, fill #0
  Filled 2022-09-23: qty 90, 90d supply, fill #1

## 2022-07-03 ENCOUNTER — Other Ambulatory Visit: Payer: Self-pay

## 2022-07-16 DIAGNOSIS — M5416 Radiculopathy, lumbar region: Secondary | ICD-10-CM | POA: Diagnosis not present

## 2022-07-16 DIAGNOSIS — Z6831 Body mass index (BMI) 31.0-31.9, adult: Secondary | ICD-10-CM | POA: Diagnosis not present

## 2022-08-04 ENCOUNTER — Other Ambulatory Visit: Payer: Self-pay

## 2022-08-27 DIAGNOSIS — E1129 Type 2 diabetes mellitus with other diabetic kidney complication: Secondary | ICD-10-CM | POA: Diagnosis not present

## 2022-08-27 DIAGNOSIS — E781 Pure hyperglyceridemia: Secondary | ICD-10-CM | POA: Diagnosis not present

## 2022-08-27 DIAGNOSIS — R972 Elevated prostate specific antigen [PSA]: Secondary | ICD-10-CM | POA: Diagnosis not present

## 2022-08-27 DIAGNOSIS — Z794 Long term (current) use of insulin: Secondary | ICD-10-CM | POA: Diagnosis not present

## 2022-08-27 DIAGNOSIS — R809 Proteinuria, unspecified: Secondary | ICD-10-CM | POA: Diagnosis not present

## 2022-08-27 DIAGNOSIS — E538 Deficiency of other specified B group vitamins: Secondary | ICD-10-CM | POA: Diagnosis not present

## 2022-08-29 ENCOUNTER — Other Ambulatory Visit: Payer: Self-pay

## 2022-08-31 ENCOUNTER — Other Ambulatory Visit: Payer: Self-pay

## 2022-09-01 ENCOUNTER — Other Ambulatory Visit: Payer: Self-pay

## 2022-09-01 MED ORDER — FENOFIBRATE 160 MG PO TABS
160.0000 mg | ORAL_TABLET | Freq: Every day | ORAL | 3 refills | Status: DC
  Filled 2022-09-01: qty 90, 90d supply, fill #0
  Filled 2022-12-02: qty 90, 90d supply, fill #1
  Filled 2023-03-09: qty 90, 90d supply, fill #2
  Filled 2023-06-10 (×2): qty 90, 90d supply, fill #3

## 2022-09-03 DIAGNOSIS — E538 Deficiency of other specified B group vitamins: Secondary | ICD-10-CM | POA: Diagnosis not present

## 2022-09-03 DIAGNOSIS — D518 Other vitamin B12 deficiency anemias: Secondary | ICD-10-CM | POA: Diagnosis not present

## 2022-09-03 DIAGNOSIS — E1165 Type 2 diabetes mellitus with hyperglycemia: Secondary | ICD-10-CM | POA: Diagnosis not present

## 2022-09-03 DIAGNOSIS — Z125 Encounter for screening for malignant neoplasm of prostate: Secondary | ICD-10-CM | POA: Diagnosis not present

## 2022-09-03 DIAGNOSIS — M48062 Spinal stenosis, lumbar region with neurogenic claudication: Secondary | ICD-10-CM | POA: Diagnosis not present

## 2022-09-03 DIAGNOSIS — E781 Pure hyperglyceridemia: Secondary | ICD-10-CM | POA: Diagnosis not present

## 2022-09-03 DIAGNOSIS — I10 Essential (primary) hypertension: Secondary | ICD-10-CM | POA: Diagnosis not present

## 2022-09-05 ENCOUNTER — Other Ambulatory Visit: Payer: Self-pay

## 2022-09-17 ENCOUNTER — Other Ambulatory Visit: Payer: Self-pay

## 2022-09-17 DIAGNOSIS — M961 Postlaminectomy syndrome, not elsewhere classified: Secondary | ICD-10-CM | POA: Diagnosis not present

## 2022-09-17 DIAGNOSIS — F1120 Opioid dependence, uncomplicated: Secondary | ICD-10-CM | POA: Diagnosis not present

## 2022-09-17 DIAGNOSIS — M5416 Radiculopathy, lumbar region: Secondary | ICD-10-CM | POA: Diagnosis not present

## 2022-09-17 MED ORDER — OXYCODONE-ACETAMINOPHEN 5-325 MG PO TABS
1.0000 | ORAL_TABLET | Freq: Four times a day (QID) | ORAL | 0 refills | Status: AC | PRN
  Filled 2022-10-07: qty 120, 30d supply, fill #0

## 2022-09-17 MED ORDER — OXYCODONE-ACETAMINOPHEN 5-325 MG PO TABS
1.0000 | ORAL_TABLET | Freq: Four times a day (QID) | ORAL | 0 refills | Status: DC | PRN
  Filled 2022-12-05: qty 120, 30d supply, fill #0

## 2022-09-17 MED ORDER — OXYCODONE-ACETAMINOPHEN 5-325 MG PO TABS
1.0000 | ORAL_TABLET | Freq: Four times a day (QID) | ORAL | 0 refills | Status: AC | PRN
  Filled 2022-11-06: qty 120, 30d supply, fill #0

## 2022-09-23 ENCOUNTER — Other Ambulatory Visit: Payer: Self-pay

## 2022-09-24 ENCOUNTER — Other Ambulatory Visit: Payer: Self-pay

## 2022-09-24 MED ORDER — LISINOPRIL 40 MG PO TABS
40.0000 mg | ORAL_TABLET | Freq: Every day | ORAL | 1 refills | Status: DC
  Filled 2022-09-24: qty 90, 90d supply, fill #0
  Filled 2022-12-25: qty 90, 90d supply, fill #1

## 2022-09-29 ENCOUNTER — Other Ambulatory Visit: Payer: Self-pay

## 2022-09-29 DIAGNOSIS — I10 Essential (primary) hypertension: Secondary | ICD-10-CM | POA: Diagnosis not present

## 2022-09-29 DIAGNOSIS — Z794 Long term (current) use of insulin: Secondary | ICD-10-CM | POA: Diagnosis not present

## 2022-09-29 DIAGNOSIS — E1129 Type 2 diabetes mellitus with other diabetic kidney complication: Secondary | ICD-10-CM | POA: Diagnosis not present

## 2022-09-29 DIAGNOSIS — R809 Proteinuria, unspecified: Secondary | ICD-10-CM | POA: Diagnosis not present

## 2022-09-29 DIAGNOSIS — E781 Pure hyperglyceridemia: Secondary | ICD-10-CM | POA: Diagnosis not present

## 2022-09-29 MED ORDER — INSULIN LISPRO PROT & LISPRO (75-25 MIX) 100 UNIT/ML KWIKPEN
PEN_INJECTOR | SUBCUTANEOUS | 5 refills | Status: AC
Start: 2022-09-29 — End: ?
  Filled 2022-09-29: qty 30, 23d supply, fill #0

## 2022-10-07 ENCOUNTER — Other Ambulatory Visit: Payer: Self-pay

## 2022-10-15 ENCOUNTER — Other Ambulatory Visit: Payer: Self-pay

## 2022-11-06 ENCOUNTER — Other Ambulatory Visit: Payer: Self-pay

## 2022-11-13 DIAGNOSIS — M5412 Radiculopathy, cervical region: Secondary | ICD-10-CM | POA: Diagnosis not present

## 2022-11-13 DIAGNOSIS — G5602 Carpal tunnel syndrome, left upper limb: Secondary | ICD-10-CM | POA: Diagnosis not present

## 2022-11-27 ENCOUNTER — Other Ambulatory Visit: Payer: Self-pay

## 2022-11-27 MED ORDER — SEMAGLUTIDE 14 MG PO TABS
14.0000 mg | ORAL_TABLET | Freq: Every day | ORAL | 3 refills | Status: DC
  Filled 2022-11-27: qty 90, 90d supply, fill #0

## 2022-12-02 ENCOUNTER — Other Ambulatory Visit: Payer: Self-pay

## 2022-12-05 ENCOUNTER — Other Ambulatory Visit (HOSPITAL_COMMUNITY): Payer: Self-pay

## 2022-12-05 ENCOUNTER — Other Ambulatory Visit: Payer: Self-pay

## 2022-12-24 DIAGNOSIS — E1129 Type 2 diabetes mellitus with other diabetic kidney complication: Secondary | ICD-10-CM | POA: Diagnosis not present

## 2022-12-24 DIAGNOSIS — Z794 Long term (current) use of insulin: Secondary | ICD-10-CM | POA: Diagnosis not present

## 2022-12-24 DIAGNOSIS — M5416 Radiculopathy, lumbar region: Secondary | ICD-10-CM | POA: Diagnosis not present

## 2022-12-24 DIAGNOSIS — R809 Proteinuria, unspecified: Secondary | ICD-10-CM | POA: Diagnosis not present

## 2022-12-24 DIAGNOSIS — M5412 Radiculopathy, cervical region: Secondary | ICD-10-CM | POA: Diagnosis not present

## 2022-12-24 DIAGNOSIS — M961 Postlaminectomy syndrome, not elsewhere classified: Secondary | ICD-10-CM | POA: Diagnosis not present

## 2022-12-24 DIAGNOSIS — F1120 Opioid dependence, uncomplicated: Secondary | ICD-10-CM | POA: Diagnosis not present

## 2022-12-25 ENCOUNTER — Other Ambulatory Visit: Payer: Self-pay

## 2022-12-25 DIAGNOSIS — G5602 Carpal tunnel syndrome, left upper limb: Secondary | ICD-10-CM | POA: Diagnosis not present

## 2022-12-26 ENCOUNTER — Other Ambulatory Visit: Payer: Self-pay

## 2022-12-26 MED ORDER — AMLODIPINE BESYLATE 5 MG PO TABS
5.0000 mg | ORAL_TABLET | Freq: Every day | ORAL | 1 refills | Status: DC
  Filled 2022-12-26: qty 90, 90d supply, fill #0
  Filled 2023-03-27: qty 90, 90d supply, fill #1

## 2022-12-30 ENCOUNTER — Other Ambulatory Visit: Payer: Self-pay

## 2022-12-30 DIAGNOSIS — I10 Essential (primary) hypertension: Secondary | ICD-10-CM | POA: Diagnosis not present

## 2022-12-30 DIAGNOSIS — E1129 Type 2 diabetes mellitus with other diabetic kidney complication: Secondary | ICD-10-CM | POA: Diagnosis not present

## 2022-12-30 DIAGNOSIS — E781 Pure hyperglyceridemia: Secondary | ICD-10-CM | POA: Diagnosis not present

## 2022-12-30 DIAGNOSIS — Z794 Long term (current) use of insulin: Secondary | ICD-10-CM | POA: Diagnosis not present

## 2022-12-30 DIAGNOSIS — R809 Proteinuria, unspecified: Secondary | ICD-10-CM | POA: Diagnosis not present

## 2022-12-30 MED ORDER — MOUNJARO 2.5 MG/0.5ML ~~LOC~~ SOAJ
2.5000 mg | SUBCUTANEOUS | 5 refills | Status: DC
  Filled 2022-12-30: qty 2, 28d supply, fill #0
  Filled 2023-01-26: qty 2, 28d supply, fill #1
  Filled 2023-02-27: qty 2, 28d supply, fill #2

## 2022-12-31 ENCOUNTER — Other Ambulatory Visit: Payer: Self-pay

## 2023-01-06 ENCOUNTER — Other Ambulatory Visit: Payer: Self-pay

## 2023-01-06 MED ORDER — OXYCODONE-ACETAMINOPHEN 5-325 MG PO TABS
1.0000 | ORAL_TABLET | Freq: Four times a day (QID) | ORAL | 0 refills | Status: AC | PRN
  Filled 2023-02-05: qty 120, 30d supply, fill #0

## 2023-01-06 MED ORDER — OXYCODONE-ACETAMINOPHEN 5-325 MG PO TABS
1.0000 | ORAL_TABLET | Freq: Four times a day (QID) | ORAL | 0 refills | Status: AC | PRN
  Filled 2023-01-06: qty 120, 30d supply, fill #0

## 2023-01-06 MED ORDER — OXYCODONE-ACETAMINOPHEN 5-325 MG PO TABS
1.0000 | ORAL_TABLET | Freq: Four times a day (QID) | ORAL | 0 refills | Status: DC | PRN
  Filled 2023-03-09: qty 120, 30d supply, fill #0

## 2023-01-26 ENCOUNTER — Other Ambulatory Visit: Payer: Self-pay

## 2023-02-05 ENCOUNTER — Other Ambulatory Visit: Payer: Self-pay

## 2023-02-26 ENCOUNTER — Other Ambulatory Visit: Payer: Self-pay

## 2023-02-26 DIAGNOSIS — Z6831 Body mass index (BMI) 31.0-31.9, adult: Secondary | ICD-10-CM | POA: Diagnosis not present

## 2023-02-26 DIAGNOSIS — M5416 Radiculopathy, lumbar region: Secondary | ICD-10-CM | POA: Diagnosis not present

## 2023-02-26 MED ORDER — METHYLPREDNISOLONE 4 MG PO TBPK
ORAL_TABLET | ORAL | 0 refills | Status: DC
  Filled 2023-02-26: qty 21, 6d supply, fill #0

## 2023-02-27 ENCOUNTER — Other Ambulatory Visit: Payer: Self-pay

## 2023-03-04 DIAGNOSIS — E1165 Type 2 diabetes mellitus with hyperglycemia: Secondary | ICD-10-CM | POA: Diagnosis not present

## 2023-03-04 DIAGNOSIS — E538 Deficiency of other specified B group vitamins: Secondary | ICD-10-CM | POA: Diagnosis not present

## 2023-03-04 DIAGNOSIS — Z125 Encounter for screening for malignant neoplasm of prostate: Secondary | ICD-10-CM | POA: Diagnosis not present

## 2023-03-04 DIAGNOSIS — I10 Essential (primary) hypertension: Secondary | ICD-10-CM | POA: Diagnosis not present

## 2023-03-04 DIAGNOSIS — E781 Pure hyperglyceridemia: Secondary | ICD-10-CM | POA: Diagnosis not present

## 2023-03-09 ENCOUNTER — Other Ambulatory Visit: Payer: Self-pay

## 2023-03-11 ENCOUNTER — Other Ambulatory Visit: Payer: Self-pay

## 2023-03-11 DIAGNOSIS — D518 Other vitamin B12 deficiency anemias: Secondary | ICD-10-CM | POA: Diagnosis not present

## 2023-03-11 DIAGNOSIS — I10 Essential (primary) hypertension: Secondary | ICD-10-CM | POA: Diagnosis not present

## 2023-03-11 DIAGNOSIS — E781 Pure hyperglyceridemia: Secondary | ICD-10-CM | POA: Diagnosis not present

## 2023-03-11 DIAGNOSIS — E1165 Type 2 diabetes mellitus with hyperglycemia: Secondary | ICD-10-CM | POA: Diagnosis not present

## 2023-03-11 DIAGNOSIS — M48062 Spinal stenosis, lumbar region with neurogenic claudication: Secondary | ICD-10-CM | POA: Diagnosis not present

## 2023-03-11 DIAGNOSIS — Z1211 Encounter for screening for malignant neoplasm of colon: Secondary | ICD-10-CM | POA: Diagnosis not present

## 2023-03-11 DIAGNOSIS — Z0000 Encounter for general adult medical examination without abnormal findings: Secondary | ICD-10-CM | POA: Diagnosis not present

## 2023-03-11 DIAGNOSIS — E538 Deficiency of other specified B group vitamins: Secondary | ICD-10-CM | POA: Diagnosis not present

## 2023-03-11 MED ORDER — MOUNJARO 5 MG/0.5ML ~~LOC~~ SOAJ
5.0000 mg | SUBCUTANEOUS | 3 refills | Status: DC
  Filled 2023-03-11 – 2023-03-27 (×3): qty 2, 28d supply, fill #0
  Filled 2023-04-29: qty 2, 28d supply, fill #1
  Filled 2023-05-20 – 2023-05-22 (×2): qty 2, 28d supply, fill #2
  Filled 2023-06-22: qty 2, 28d supply, fill #3

## 2023-03-25 ENCOUNTER — Other Ambulatory Visit: Payer: Self-pay

## 2023-03-25 DIAGNOSIS — M961 Postlaminectomy syndrome, not elsewhere classified: Secondary | ICD-10-CM | POA: Diagnosis not present

## 2023-03-25 DIAGNOSIS — M5412 Radiculopathy, cervical region: Secondary | ICD-10-CM | POA: Diagnosis not present

## 2023-03-25 DIAGNOSIS — F1120 Opioid dependence, uncomplicated: Secondary | ICD-10-CM | POA: Diagnosis not present

## 2023-03-25 DIAGNOSIS — M5416 Radiculopathy, lumbar region: Secondary | ICD-10-CM | POA: Diagnosis not present

## 2023-03-25 MED ORDER — OXYCODONE-ACETAMINOPHEN 5-325 MG PO TABS
ORAL_TABLET | Freq: Four times a day (QID) | ORAL | 0 refills | Status: AC | PRN
  Filled 2023-05-11: qty 120, 30d supply, fill #0

## 2023-03-25 MED ORDER — OXYCODONE-ACETAMINOPHEN 5-325 MG PO TABS
ORAL_TABLET | Freq: Four times a day (QID) | ORAL | 0 refills | Status: AC | PRN
  Filled 2023-06-10 (×2): qty 120, 30d supply, fill #0

## 2023-03-25 MED ORDER — OXYCODONE-ACETAMINOPHEN 5-325 MG PO TABS
ORAL_TABLET | Freq: Four times a day (QID) | ORAL | 0 refills | Status: AC | PRN
  Filled 2023-04-09: qty 120, 30d supply, fill #0

## 2023-03-27 ENCOUNTER — Other Ambulatory Visit: Payer: Self-pay

## 2023-03-27 MED ORDER — LISINOPRIL 40 MG PO TABS
40.0000 mg | ORAL_TABLET | Freq: Every day | ORAL | 1 refills | Status: DC
  Filled 2023-03-27: qty 90, 90d supply, fill #0
  Filled 2023-07-01: qty 90, 90d supply, fill #1

## 2023-03-30 DIAGNOSIS — Z794 Long term (current) use of insulin: Secondary | ICD-10-CM | POA: Diagnosis not present

## 2023-03-30 DIAGNOSIS — E1129 Type 2 diabetes mellitus with other diabetic kidney complication: Secondary | ICD-10-CM | POA: Diagnosis not present

## 2023-03-30 DIAGNOSIS — R809 Proteinuria, unspecified: Secondary | ICD-10-CM | POA: Diagnosis not present

## 2023-04-06 DIAGNOSIS — I10 Essential (primary) hypertension: Secondary | ICD-10-CM | POA: Diagnosis not present

## 2023-04-06 DIAGNOSIS — E119 Type 2 diabetes mellitus without complications: Secondary | ICD-10-CM | POA: Diagnosis not present

## 2023-04-06 DIAGNOSIS — E781 Pure hyperglyceridemia: Secondary | ICD-10-CM | POA: Diagnosis not present

## 2023-04-08 DIAGNOSIS — Z6831 Body mass index (BMI) 31.0-31.9, adult: Secondary | ICD-10-CM | POA: Diagnosis not present

## 2023-04-08 DIAGNOSIS — M5416 Radiculopathy, lumbar region: Secondary | ICD-10-CM | POA: Diagnosis not present

## 2023-04-09 ENCOUNTER — Other Ambulatory Visit: Payer: Self-pay

## 2023-04-22 DIAGNOSIS — H25013 Cortical age-related cataract, bilateral: Secondary | ICD-10-CM | POA: Diagnosis not present

## 2023-04-22 DIAGNOSIS — H524 Presbyopia: Secondary | ICD-10-CM | POA: Diagnosis not present

## 2023-04-22 DIAGNOSIS — H40003 Preglaucoma, unspecified, bilateral: Secondary | ICD-10-CM | POA: Diagnosis not present

## 2023-04-22 DIAGNOSIS — E119 Type 2 diabetes mellitus without complications: Secondary | ICD-10-CM | POA: Diagnosis not present

## 2023-04-29 ENCOUNTER — Other Ambulatory Visit: Payer: Self-pay

## 2023-04-30 DIAGNOSIS — M5416 Radiculopathy, lumbar region: Secondary | ICD-10-CM | POA: Diagnosis not present

## 2023-05-01 ENCOUNTER — Other Ambulatory Visit: Payer: Self-pay

## 2023-05-11 ENCOUNTER — Other Ambulatory Visit: Payer: Self-pay

## 2023-05-20 ENCOUNTER — Other Ambulatory Visit: Payer: Self-pay

## 2023-05-21 DIAGNOSIS — Z6830 Body mass index (BMI) 30.0-30.9, adult: Secondary | ICD-10-CM | POA: Diagnosis not present

## 2023-05-21 DIAGNOSIS — M5416 Radiculopathy, lumbar region: Secondary | ICD-10-CM | POA: Diagnosis not present

## 2023-05-22 ENCOUNTER — Other Ambulatory Visit: Payer: Self-pay

## 2023-06-10 ENCOUNTER — Other Ambulatory Visit: Payer: Self-pay

## 2023-06-10 ENCOUNTER — Other Ambulatory Visit (HOSPITAL_COMMUNITY): Payer: Self-pay

## 2023-06-11 ENCOUNTER — Other Ambulatory Visit: Payer: Self-pay

## 2023-06-17 ENCOUNTER — Other Ambulatory Visit: Payer: Self-pay

## 2023-06-17 ENCOUNTER — Other Ambulatory Visit (HOSPITAL_COMMUNITY): Payer: Self-pay

## 2023-06-17 DIAGNOSIS — F1120 Opioid dependence, uncomplicated: Secondary | ICD-10-CM | POA: Diagnosis not present

## 2023-06-17 DIAGNOSIS — M961 Postlaminectomy syndrome, not elsewhere classified: Secondary | ICD-10-CM | POA: Diagnosis not present

## 2023-06-17 DIAGNOSIS — M5416 Radiculopathy, lumbar region: Secondary | ICD-10-CM | POA: Diagnosis not present

## 2023-06-17 MED ORDER — OXYCODONE-ACETAMINOPHEN 5-325 MG PO TABS
1.0000 | ORAL_TABLET | Freq: Four times a day (QID) | ORAL | 0 refills | Status: AC | PRN
  Filled 2023-08-12: qty 120, 30d supply, fill #0

## 2023-06-17 MED ORDER — OXYCODONE-ACETAMINOPHEN 5-325 MG PO TABS
1.0000 | ORAL_TABLET | Freq: Four times a day (QID) | ORAL | 0 refills | Status: AC | PRN
  Filled 2023-07-13: qty 120, 30d supply, fill #0

## 2023-06-17 MED ORDER — OXYCODONE-ACETAMINOPHEN 5-325 MG PO TABS
1.0000 | ORAL_TABLET | Freq: Four times a day (QID) | ORAL | 0 refills | Status: DC | PRN
  Filled 2023-09-11: qty 44, 11d supply, fill #0
  Filled 2023-09-11: qty 76, 19d supply, fill #0

## 2023-06-17 MED ORDER — GABAPENTIN 300 MG PO CAPS
300.0000 mg | ORAL_CAPSULE | Freq: Every day | ORAL | 3 refills | Status: AC
  Filled 2023-06-17: qty 180, 90d supply, fill #0

## 2023-06-18 ENCOUNTER — Other Ambulatory Visit: Payer: Self-pay

## 2023-06-18 MED ORDER — FARXIGA 10 MG PO TABS
10.0000 mg | ORAL_TABLET | Freq: Every day | ORAL | 3 refills | Status: AC
  Filled 2023-06-18: qty 90, 90d supply, fill #0
  Filled 2023-09-23: qty 90, 90d supply, fill #1
  Filled 2024-01-08: qty 90, 90d supply, fill #2

## 2023-06-22 ENCOUNTER — Other Ambulatory Visit: Payer: Self-pay

## 2023-06-22 MED ORDER — AMLODIPINE BESYLATE 5 MG PO TABS
5.0000 mg | ORAL_TABLET | Freq: Every day | ORAL | 1 refills | Status: DC
  Filled 2023-06-22: qty 90, 90d supply, fill #0
  Filled 2023-09-30: qty 90, 90d supply, fill #1

## 2023-06-24 DIAGNOSIS — M5416 Radiculopathy, lumbar region: Secondary | ICD-10-CM | POA: Diagnosis not present

## 2023-07-01 ENCOUNTER — Other Ambulatory Visit: Payer: Self-pay

## 2023-07-07 DIAGNOSIS — E119 Type 2 diabetes mellitus without complications: Secondary | ICD-10-CM | POA: Diagnosis not present

## 2023-07-13 ENCOUNTER — Other Ambulatory Visit: Payer: Self-pay

## 2023-07-13 MED ORDER — ROSUVASTATIN CALCIUM 40 MG PO TABS
ORAL_TABLET | Freq: Every day | ORAL | 1 refills | Status: AC
  Filled 2023-07-13: qty 90, 90d supply, fill #0
  Filled 2023-12-15: qty 90, 90d supply, fill #1

## 2023-07-13 MED ORDER — MOUNJARO 7.5 MG/0.5ML ~~LOC~~ SOAJ
7.5000 mg | SUBCUTANEOUS | 3 refills | Status: DC
  Filled 2023-07-13: qty 6, 84d supply, fill #0
  Filled 2023-07-14 – 2023-07-15 (×2): qty 2, 28d supply, fill #0
  Filled 2023-08-12: qty 2, 28d supply, fill #1

## 2023-07-14 ENCOUNTER — Other Ambulatory Visit: Payer: Self-pay

## 2023-07-14 DIAGNOSIS — E781 Pure hyperglyceridemia: Secondary | ICD-10-CM | POA: Diagnosis not present

## 2023-07-14 DIAGNOSIS — I10 Essential (primary) hypertension: Secondary | ICD-10-CM | POA: Diagnosis not present

## 2023-07-14 DIAGNOSIS — E119 Type 2 diabetes mellitus without complications: Secondary | ICD-10-CM | POA: Diagnosis not present

## 2023-07-15 ENCOUNTER — Other Ambulatory Visit: Payer: Self-pay

## 2023-07-16 ENCOUNTER — Other Ambulatory Visit: Payer: Self-pay

## 2023-08-04 ENCOUNTER — Other Ambulatory Visit: Payer: Self-pay

## 2023-08-04 MED ORDER — METFORMIN HCL 1000 MG PO TABS
1000.0000 mg | ORAL_TABLET | Freq: Two times a day (BID) | ORAL | 3 refills | Status: AC
  Filled 2023-08-04: qty 180, 90d supply, fill #0
  Filled 2023-11-13: qty 180, 90d supply, fill #1
  Filled 2024-02-17: qty 180, 90d supply, fill #0
  Filled 2024-02-17: qty 180, 90d supply, fill #2

## 2023-08-12 ENCOUNTER — Other Ambulatory Visit: Payer: Self-pay

## 2023-08-17 ENCOUNTER — Other Ambulatory Visit: Payer: Self-pay

## 2023-08-17 MED ORDER — PREDNISONE 10 MG PO TABS
ORAL_TABLET | ORAL | 0 refills | Status: DC
  Filled 2023-08-17: qty 12, 6d supply, fill #0

## 2023-08-27 DIAGNOSIS — E781 Pure hyperglyceridemia: Secondary | ICD-10-CM | POA: Diagnosis not present

## 2023-08-27 DIAGNOSIS — E1165 Type 2 diabetes mellitus with hyperglycemia: Secondary | ICD-10-CM | POA: Diagnosis not present

## 2023-09-09 ENCOUNTER — Other Ambulatory Visit: Payer: Self-pay

## 2023-09-09 DIAGNOSIS — E781 Pure hyperglyceridemia: Secondary | ICD-10-CM | POA: Diagnosis not present

## 2023-09-09 DIAGNOSIS — E1165 Type 2 diabetes mellitus with hyperglycemia: Secondary | ICD-10-CM | POA: Diagnosis not present

## 2023-09-09 DIAGNOSIS — Z125 Encounter for screening for malignant neoplasm of prostate: Secondary | ICD-10-CM | POA: Diagnosis not present

## 2023-09-09 DIAGNOSIS — I10 Essential (primary) hypertension: Secondary | ICD-10-CM | POA: Diagnosis not present

## 2023-09-09 DIAGNOSIS — M48062 Spinal stenosis, lumbar region with neurogenic claudication: Secondary | ICD-10-CM | POA: Diagnosis not present

## 2023-09-09 DIAGNOSIS — D518 Other vitamin B12 deficiency anemias: Secondary | ICD-10-CM | POA: Diagnosis not present

## 2023-09-09 DIAGNOSIS — E538 Deficiency of other specified B group vitamins: Secondary | ICD-10-CM | POA: Diagnosis not present

## 2023-09-09 MED ORDER — MOUNJARO 10 MG/0.5ML ~~LOC~~ SOAJ
10.0000 mg | SUBCUTANEOUS | 11 refills | Status: DC
  Filled 2023-09-09: qty 2, 28d supply, fill #0
  Filled 2023-09-30 – 2023-10-08 (×2): qty 2, 28d supply, fill #1
  Filled 2023-11-06 (×2): qty 2, 28d supply, fill #2
  Filled 2023-12-03: qty 2, 28d supply, fill #3
  Filled 2023-12-28: qty 2, 28d supply, fill #4
  Filled 2024-01-29: qty 2, 28d supply, fill #5

## 2023-09-11 ENCOUNTER — Other Ambulatory Visit: Payer: Self-pay

## 2023-09-16 ENCOUNTER — Other Ambulatory Visit: Payer: Self-pay

## 2023-09-16 DIAGNOSIS — M5416 Radiculopathy, lumbar region: Secondary | ICD-10-CM | POA: Diagnosis not present

## 2023-09-16 DIAGNOSIS — M961 Postlaminectomy syndrome, not elsewhere classified: Secondary | ICD-10-CM | POA: Diagnosis not present

## 2023-09-16 DIAGNOSIS — F1120 Opioid dependence, uncomplicated: Secondary | ICD-10-CM | POA: Diagnosis not present

## 2023-09-16 MED ORDER — OXYCODONE-ACETAMINOPHEN 5-325 MG PO TABS
1.0000 | ORAL_TABLET | Freq: Four times a day (QID) | ORAL | 0 refills | Status: AC | PRN
  Filled 2023-10-13: qty 120, 30d supply, fill #0

## 2023-09-16 MED ORDER — OXYCODONE-ACETAMINOPHEN 5-325 MG PO TABS
1.0000 | ORAL_TABLET | Freq: Four times a day (QID) | ORAL | 0 refills | Status: AC | PRN
  Filled 2023-11-13: qty 120, 30d supply, fill #0

## 2023-09-16 MED ORDER — OXYCODONE-ACETAMINOPHEN 5-325 MG PO TABS: 1.0000 | ORAL_TABLET | Freq: Four times a day (QID) | ORAL | 0 refills | Status: AC | PRN

## 2023-09-23 ENCOUNTER — Other Ambulatory Visit: Payer: Self-pay

## 2023-09-24 ENCOUNTER — Other Ambulatory Visit: Payer: Self-pay

## 2023-09-24 MED ORDER — FENOFIBRATE 160 MG PO TABS
160.0000 mg | ORAL_TABLET | Freq: Every day | ORAL | 3 refills | Status: AC
  Filled 2023-09-24: qty 90, 90d supply, fill #0
  Filled 2024-01-08: qty 90, 90d supply, fill #1

## 2023-09-30 ENCOUNTER — Other Ambulatory Visit: Payer: Self-pay

## 2023-10-01 ENCOUNTER — Other Ambulatory Visit: Payer: Self-pay

## 2023-10-01 MED ORDER — LISINOPRIL 40 MG PO TABS
40.0000 mg | ORAL_TABLET | Freq: Every day | ORAL | 1 refills | Status: AC
  Filled 2023-10-01: qty 90, 90d supply, fill #0
  Filled 2024-01-08: qty 90, 90d supply, fill #1

## 2023-10-08 ENCOUNTER — Other Ambulatory Visit: Payer: Self-pay

## 2023-10-13 ENCOUNTER — Other Ambulatory Visit: Payer: Self-pay

## 2023-10-13 DIAGNOSIS — I10 Essential (primary) hypertension: Secondary | ICD-10-CM | POA: Diagnosis not present

## 2023-10-13 DIAGNOSIS — E119 Type 2 diabetes mellitus without complications: Secondary | ICD-10-CM | POA: Diagnosis not present

## 2023-10-13 DIAGNOSIS — E781 Pure hyperglyceridemia: Secondary | ICD-10-CM | POA: Diagnosis not present

## 2023-10-13 MED ORDER — INSULIN LISPRO PROT & LISPRO (75-25 MIX) 100 UNIT/ML KWIKPEN
PEN_INJECTOR | SUBCUTANEOUS | 5 refills | Status: AC
  Filled 2023-10-13: qty 90, 84d supply, fill #0

## 2023-10-15 ENCOUNTER — Other Ambulatory Visit: Payer: Self-pay

## 2023-10-28 DIAGNOSIS — M5416 Radiculopathy, lumbar region: Secondary | ICD-10-CM | POA: Diagnosis not present

## 2023-10-28 DIAGNOSIS — M5412 Radiculopathy, cervical region: Secondary | ICD-10-CM | POA: Diagnosis not present

## 2023-11-06 ENCOUNTER — Other Ambulatory Visit (HOSPITAL_BASED_OUTPATIENT_CLINIC_OR_DEPARTMENT_OTHER): Payer: Self-pay

## 2023-11-06 ENCOUNTER — Other Ambulatory Visit: Payer: Self-pay

## 2023-11-13 ENCOUNTER — Other Ambulatory Visit: Payer: Self-pay

## 2023-12-03 ENCOUNTER — Other Ambulatory Visit: Payer: Self-pay

## 2023-12-03 ENCOUNTER — Other Ambulatory Visit (HOSPITAL_COMMUNITY): Payer: Self-pay

## 2023-12-14 ENCOUNTER — Other Ambulatory Visit: Payer: Self-pay

## 2023-12-14 DIAGNOSIS — M961 Postlaminectomy syndrome, not elsewhere classified: Secondary | ICD-10-CM | POA: Diagnosis not present

## 2023-12-14 DIAGNOSIS — F1120 Opioid dependence, uncomplicated: Secondary | ICD-10-CM | POA: Diagnosis not present

## 2023-12-14 MED ORDER — OXYCODONE-ACETAMINOPHEN 5-325 MG PO TABS
1.0000 | ORAL_TABLET | Freq: Four times a day (QID) | ORAL | 0 refills | Status: AC | PRN
  Filled 2023-12-14: qty 120, 30d supply, fill #0

## 2023-12-14 MED ORDER — OXYCODONE-ACETAMINOPHEN 5-325 MG PO TABS
1.0000 | ORAL_TABLET | Freq: Four times a day (QID) | ORAL | 0 refills | Status: AC | PRN
  Filled 2024-02-17: qty 120, 30d supply, fill #0

## 2023-12-14 MED ORDER — OXYCODONE-ACETAMINOPHEN 5-325 MG PO TABS
1.0000 | ORAL_TABLET | Freq: Four times a day (QID) | ORAL | 0 refills | Status: AC | PRN
  Filled 2024-01-15: qty 2, 1d supply, fill #0
  Filled 2024-01-15: qty 118, 29d supply, fill #0

## 2023-12-15 ENCOUNTER — Other Ambulatory Visit: Payer: Self-pay

## 2023-12-29 ENCOUNTER — Other Ambulatory Visit: Payer: Self-pay

## 2024-01-08 ENCOUNTER — Other Ambulatory Visit: Payer: Self-pay

## 2024-01-08 MED ORDER — AMLODIPINE BESYLATE 5 MG PO TABS
5.0000 mg | ORAL_TABLET | Freq: Every day | ORAL | 1 refills | Status: AC
  Filled 2024-01-08: qty 90, 90d supply, fill #0

## 2024-01-15 ENCOUNTER — Other Ambulatory Visit: Payer: Self-pay

## 2024-01-29 ENCOUNTER — Other Ambulatory Visit: Payer: Self-pay

## 2024-02-01 ENCOUNTER — Other Ambulatory Visit: Payer: Self-pay

## 2024-02-17 ENCOUNTER — Other Ambulatory Visit: Payer: Self-pay

## 2024-02-17 ENCOUNTER — Other Ambulatory Visit (HOSPITAL_COMMUNITY): Payer: Self-pay

## 2024-02-18 ENCOUNTER — Other Ambulatory Visit: Payer: Self-pay

## 2024-02-18 MED ORDER — MOUNJARO 12.5 MG/0.5ML ~~LOC~~ SOAJ
12.5000 mg | SUBCUTANEOUS | 3 refills | Status: AC
  Filled 2024-02-18: qty 6, 84d supply, fill #0
  Filled 2024-02-26 (×2): qty 2, 28d supply, fill #0

## 2024-02-18 MED ORDER — INSULIN LISPRO PROT & LISPRO (75-25 MIX) 100 UNIT/ML KWIKPEN
PEN_INJECTOR | SUBCUTANEOUS | 3 refills | Status: AC
  Filled 2024-02-18: qty 90, 84d supply, fill #0

## 2024-02-19 ENCOUNTER — Other Ambulatory Visit: Payer: Self-pay

## 2024-02-26 ENCOUNTER — Other Ambulatory Visit: Payer: Self-pay

## 2024-03-11 ENCOUNTER — Other Ambulatory Visit: Payer: Self-pay

## 2024-03-11 MED ORDER — OXYCODONE-ACETAMINOPHEN 5-325 MG PO TABS: 1.0000 | ORAL_TABLET | Freq: Four times a day (QID) | ORAL | 0 refills | Status: AC | PRN
